# Patient Record
Sex: Male | Born: 1945
Health system: Southern US, Community
[De-identification: ages and names within clinical notes are randomized; demographics above are authoritative.]

## PROBLEM LIST (undated history)

## (undated) DIAGNOSIS — E785 Hyperlipidemia, unspecified: Secondary | ICD-10-CM

## (undated) DIAGNOSIS — E119 Type 2 diabetes mellitus without complications: Secondary | ICD-10-CM

## (undated) DIAGNOSIS — J189 Pneumonia, unspecified organism: Secondary | ICD-10-CM

## (undated) DIAGNOSIS — I10 Essential (primary) hypertension: Secondary | ICD-10-CM

## (undated) HISTORY — DX: Essential (primary) hypertension: I10

## (undated) HISTORY — PX: TONSILLECTOMY: SUR1361

## (undated) HISTORY — DX: Hyperlipidemia, unspecified: E78.5

## (undated) HISTORY — DX: Type 2 diabetes mellitus without complications: E11.9

---
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
## 1994-06-02 DIAGNOSIS — J189 Pneumonia, unspecified organism: Secondary | ICD-10-CM

## 1994-06-02 HISTORY — DX: Pneumonia, unspecified organism: J18.9

## 1995-06-03 HISTORY — PX: ULNAR NERVE REPAIR: SHX2594

## 2001-03-31 ENCOUNTER — Encounter: Payer: Self-pay | Admitting: Internal Medicine

## 2001-03-31 ENCOUNTER — Encounter: Admission: RE | Admit: 2001-03-31 | Discharge: 2001-03-31 | Payer: Self-pay | Admitting: Internal Medicine

## 2011-05-23 ENCOUNTER — Other Ambulatory Visit: Payer: Self-pay | Admitting: Internal Medicine

## 2011-05-23 DIAGNOSIS — R1011 Right upper quadrant pain: Secondary | ICD-10-CM

## 2011-05-28 ENCOUNTER — Ambulatory Visit
Admission: RE | Admit: 2011-05-28 | Discharge: 2011-05-28 | Disposition: A | Discharge status: Home / Self Care | Payer: BC Managed Care – PPO | Source: Ambulatory Visit | Attending: Internal Medicine | Admitting: Internal Medicine

## 2011-05-28 DIAGNOSIS — R1011 Right upper quadrant pain: Secondary | ICD-10-CM

## 2012-05-27 ENCOUNTER — Other Ambulatory Visit: Payer: Self-pay | Admitting: Internal Medicine

## 2012-05-27 DIAGNOSIS — R1011 Right upper quadrant pain: Secondary | ICD-10-CM

## 2012-05-28 ENCOUNTER — Ambulatory Visit
Admission: RE | Admit: 2012-05-28 | Discharge: 2012-05-28 | Disposition: A | Discharge status: Home / Self Care | Payer: BC Managed Care – PPO | Source: Ambulatory Visit | Attending: Internal Medicine | Admitting: Internal Medicine

## 2012-05-28 DIAGNOSIS — R1011 Right upper quadrant pain: Secondary | ICD-10-CM

## 2012-06-03 ENCOUNTER — Encounter (INDEPENDENT_AMBULATORY_CARE_PROVIDER_SITE_OTHER): Payer: Self-pay | Admitting: Surgery

## 2012-06-14 ENCOUNTER — Encounter (INDEPENDENT_AMBULATORY_CARE_PROVIDER_SITE_OTHER): Payer: Self-pay | Admitting: Surgery

## 2012-06-14 ENCOUNTER — Ambulatory Visit (INDEPENDENT_AMBULATORY_CARE_PROVIDER_SITE_OTHER): Payer: BC Managed Care – PPO | Admitting: Surgery

## 2012-06-14 VITALS — BP 132/88 | HR 64 | Temp 97.9°F | Resp 16 | Ht 69.0 in | Wt 256.8 lb

## 2012-06-14 DIAGNOSIS — K8010 Calculus of gallbladder with chronic cholecystitis without obstruction: Secondary | ICD-10-CM

## 2012-06-14 NOTE — Progress Notes (Signed)
Patient ID: Jason Garrett, male   DOB: [birth year], 67 y.o.   MRN: [id number]  Chief Complaint  Patient presents with  . Cholelithiasis    new pt- eval gb w/ stones    HPI Jason Garrett is a 67 y.o. male.  Referred by Dr. Su Hilt for evaluation of gallbladder disease. HPI This is a 67 year old male who presents with a five-year history of intermittent right upper quadrant abdominal pain associated with abdominal bloating and radiation of pain through to his back. He did have an ultrasound in 2012 that showed some gallstones but apparently there was a transcription error and the conclusions of the ultrasound read "no gallstones". His symptoms have become slightly more frequent and the patient had a repeat ultrasound which showed multiple gallstones but no sign of cholecystitis. Liver function test are normal. The symptoms did not seem to be exacerbated by any type of food. The patient claims to eat a low-fat diet. He denies any nausea, vomiting, and does report episodes of diarrhea about once a week.  Past Medical History  Diagnosis Date  . Diabetes mellitus without complication   . Hypertension   . Hyperlipidemia     Past Surgical History  Procedure Date  . Ulnar nerve repair 1997  . Hernia repair 1950    Family History  Problem Relation Age of Onset  . Cancer Father     skin    Social History History  Substance Use Topics  . Smoking status: Never Smoker   . Smokeless tobacco: Not on file  . Alcohol Use: Yes    Allergies  Allergen Reactions  . Prilocaine Hives  This is an old type of local anesthetic.  He states that he did have some lidocaine with his ulnar nerve surgery without any problems.  Current Outpatient Prescriptions  Medication Sig Dispense Refill  . aspirin 81 MG tablet Take 81 mg by mouth daily.      . chlorthalidone (HYGROTON) 25 MG tablet Take 25 mg by mouth daily.      Marland Kitchen doxazosin (CARDURA) 1 MG tablet Take 1 mg by mouth at bedtime.      Marland Kitchen  glimepiride (AMARYL) 2 MG tablet Take 2 mg by mouth daily before breakfast.      . losartan (COZAAR) 25 MG tablet Take 25 mg by mouth daily.      . simvastatin (ZOCOR) 20 MG tablet Take 20 mg by mouth every evening.      . verapamil (COVERA HS) 240 MG (CO) 24 hr tablet Take 240 mg by mouth at bedtime.        Review of Systems Review of Systems  Constitutional: Negative for fever, chills and unexpected weight change.  HENT: Negative for hearing loss, congestion, sore throat, trouble swallowing and voice change.   Eyes: Negative for visual disturbance.  Respiratory: Negative for cough and wheezing.   Cardiovascular: Negative for chest pain, palpitations and leg swelling.  Gastrointestinal: Positive for abdominal pain, diarrhea and abdominal distention. Negative for nausea, vomiting, constipation, blood in stool, anal bleeding and rectal pain.  Genitourinary: Negative for hematuria and difficulty urinating.  Musculoskeletal: Negative for arthralgias.  Skin: Negative for rash and wound.  Neurological: Negative for seizures, syncope, weakness and headaches.  Hematological: Negative for adenopathy. Does not bruise/bleed easily.  Psychiatric/Behavioral: Negative for confusion.    Blood pressure 132/88, pulse 64, temperature 97.9 F (36.6 C), temperature source Temporal, resp. rate 16, height 5\' 9"  (1.753 m), weight 256 lb 12.8 oz (116.484 kg).  Physical Exam Physical Exam WDWN in NAD HEENT:  EOMI, sclera anicteric Neck:  No masses, no thyromegaly Lungs:  CTA bilaterally; normal respiratory effort CV:  Regular rate and rhythm; no murmurs Abd:  +bowel sounds, obese, soft, minimal RUQ tenderness Ext:  Well-perfused; no edema Skin:  Warm, dry; no sign of jaundice  Data Reviewed RADIOLOGY REPORT*  Clinical Data: Right upper quadrant pain.  COMPLETE ABDOMINAL ULTRASOUND  Comparison: Ultrasound dated 05/28/2011  Findings:  Gallbladder: Numerous gallstones. Negative sonographic Murphy's    sign. No gallbladder wall thickening.  Common bile duct: Normal. 4.2 mm in diameter.  Liver: Liver parenchyma is echogenic consistent with fat pad  steatosis, unchanged.  IVC: Appears normal.  Pancreas: Head and body of the pancreas are normal. Tail the  pancreas is obscured by bowel.  Spleen: Normal. 10.6 cm in length.  Right Kidney: 13.5 cm in length. 1.9 cm simple appearing cyst in  the mid kidney. 8 millimeter exophytic cyst.  Left Kidney: 14.3 cm in length. 3.7 cm cyst on the upper pole. 8  mm cyst in the midportion of the left kidney. 3.2 cm cyst in the  lower pole.  Abdominal aorta: 2.8 cm maximum diameter.  IMPRESSION:  Multiple gallstones. Stable renal cysts. The tail of the pancreas  is obscured by bowel gas.    Assessment    Chronic calculous cholecystitis    Plan    Laparoscopic cholecystectomy with intraoperative cholangiogram.  The surgical procedure has been discussed with the patient.  Potential risks, benefits, alternative treatments, and expected outcomes have been explained.  All of the patient's questions at this time have been answered.  The likelihood of reaching the patient's treatment goal is good.  The patient understand the proposed surgical procedure and wishes to proceed.        Arne Schlender K. 06/14/2012, 10:01 AM

## 2012-06-14 NOTE — Patient Instructions (Signed)
Stop your aspirin 5 days before surgery.

## 2012-07-02 ENCOUNTER — Encounter (HOSPITAL_COMMUNITY): Payer: Self-pay | Admitting: *Deleted

## 2012-07-02 ENCOUNTER — Encounter (HOSPITAL_COMMUNITY): Payer: Self-pay | Admitting: Pharmacy Technician

## 2012-07-05 ENCOUNTER — Other Ambulatory Visit (HOSPITAL_COMMUNITY): Payer: Self-pay | Admitting: *Deleted

## 2012-07-05 NOTE — Progress Notes (Signed)
EKG from Dr. Su Hilt office 05/24/2012 on chart.

## 2012-07-14 ENCOUNTER — Ambulatory Visit (HOSPITAL_COMMUNITY): Payer: BC Managed Care – PPO | Admitting: Anesthesiology

## 2012-07-14 ENCOUNTER — Encounter (HOSPITAL_COMMUNITY): Payer: Self-pay | Admitting: *Deleted

## 2012-07-14 ENCOUNTER — Ambulatory Visit (HOSPITAL_COMMUNITY): Payer: BC Managed Care – PPO

## 2012-07-14 ENCOUNTER — Encounter (HOSPITAL_COMMUNITY)
Admission: RE | Disposition: A | Discharge status: Home / Self Care | Payer: Self-pay | Source: Ambulatory Visit | Attending: Surgery

## 2012-07-14 ENCOUNTER — Encounter (HOSPITAL_COMMUNITY): Payer: Self-pay | Admitting: Anesthesiology

## 2012-07-14 ENCOUNTER — Ambulatory Visit (HOSPITAL_COMMUNITY)
Admission: RE | Admit: 2012-07-14 | Discharge: 2012-07-14 | Disposition: A | Discharge status: Home / Self Care | Payer: BC Managed Care – PPO | Source: Ambulatory Visit | Attending: Surgery | Admitting: Surgery

## 2012-07-14 DIAGNOSIS — Z79899 Other long term (current) drug therapy: Secondary | ICD-10-CM | POA: Insufficient documentation

## 2012-07-14 DIAGNOSIS — I10 Essential (primary) hypertension: Secondary | ICD-10-CM | POA: Insufficient documentation

## 2012-07-14 DIAGNOSIS — E785 Hyperlipidemia, unspecified: Secondary | ICD-10-CM | POA: Insufficient documentation

## 2012-07-14 DIAGNOSIS — K8010 Calculus of gallbladder with chronic cholecystitis without obstruction: Secondary | ICD-10-CM | POA: Insufficient documentation

## 2012-07-14 DIAGNOSIS — E119 Type 2 diabetes mellitus without complications: Secondary | ICD-10-CM | POA: Insufficient documentation

## 2012-07-14 DIAGNOSIS — Z7982 Long term (current) use of aspirin: Secondary | ICD-10-CM | POA: Insufficient documentation

## 2012-07-14 HISTORY — DX: Pneumonia, unspecified organism: J18.9

## 2012-07-14 HISTORY — PX: CHOLECYSTECTOMY: SHX55

## 2012-07-14 LAB — GLUCOSE, CAPILLARY
Glucose-Capillary: 82 mg/dL (ref 70–99)
Glucose-Capillary: 119 mg/dL — ABNORMAL HIGH (ref 70–99)

## 2012-07-14 LAB — BASIC METABOLIC PANEL
CO2: 30 mEq/L (ref 19–32)
Chloride: 100 mEq/L (ref 96–112)
Creatinine, Ser: 1.63 mg/dL — ABNORMAL HIGH (ref 0.50–1.35)
GFR calc Af Amer: 49 mL/min — ABNORMAL LOW (ref >90)
Potassium: 3.2 mEq/L — ABNORMAL LOW (ref 3.5–5.1)
Sodium: 139 mEq/L (ref 135–145)

## 2012-07-14 LAB — CBC
HCT: 42.9 % (ref 39.0–52.0)
Hemoglobin: 15.3 g/dL (ref 13.0–17.0)
MCV: 86.3 fL (ref 78.0–100.0)
RBC: 4.97 MIL/uL (ref 4.22–5.81)
RDW: 12.9 % (ref 11.5–15.5)
WBC: 7.7 10*3/uL (ref 4.0–10.5)

## 2012-07-14 LAB — SURGICAL PCR SCREEN: Staphylococcus aureus: NEGATIVE

## 2012-07-14 SURGERY — LAPAROSCOPIC CHOLECYSTECTOMY WITH INTRAOPERATIVE CHOLANGIOGRAM
Anesthesia: General | Wound class: Clean Contaminated

## 2012-07-14 MED ORDER — LACTATED RINGERS IR SOLN
Status: DC | PRN
  Administered 2012-07-14: 1

## 2012-07-14 MED ORDER — HYDROMORPHONE HCL PF 1 MG/ML IJ SOLN: 0.2500 mg | INTRAMUSCULAR | Status: DC | PRN

## 2012-07-14 MED ORDER — MORPHINE SULFATE 10 MG/ML IJ SOLN: 2.0000 mg | INTRAMUSCULAR | Status: DC | PRN

## 2012-07-14 MED ORDER — GLYCOPYRROLATE 0.2 MG/ML IJ SOLN
INTRAMUSCULAR | Status: DC | PRN
  Administered 2012-07-14: .6 mg via INTRAVENOUS

## 2012-07-14 MED ORDER — ACETAMINOPHEN 10 MG/ML IV SOLN
INTRAVENOUS | Status: DC | PRN
  Administered 2012-07-14: 1000 mg via INTRAVENOUS

## 2012-07-14 MED ORDER — OXYCODONE-ACETAMINOPHEN 5-325 MG PO TABS: 1.0000 | ORAL_TABLET | ORAL | Status: DC | PRN

## 2012-07-14 MED ORDER — PROMETHAZINE HCL 25 MG/ML IJ SOLN: 6.2500 mg | INTRAMUSCULAR | Status: DC | PRN

## 2012-07-14 MED ORDER — SODIUM CHLORIDE 0.9 % IV SOLN
INTRAVENOUS | Status: DC | PRN
  Administered 2012-07-14: 09:00:00

## 2012-07-14 MED ORDER — 0.9 % SODIUM CHLORIDE (POUR BTL) OPTIME
TOPICAL | Status: DC | PRN
  Administered 2012-07-14: 1000 mL

## 2012-07-14 MED ORDER — ONDANSETRON HCL 4 MG/2ML IJ SOLN: 4.0000 mg | INTRAMUSCULAR | Status: DC | PRN

## 2012-07-14 MED ORDER — LACTATED RINGERS IV SOLN
INTRAVENOUS | Status: DC | PRN
  Administered 2012-07-14 (×2): via INTRAVENOUS

## 2012-07-14 MED ORDER — IOHEXOL 300 MG/ML  SOLN
INTRAMUSCULAR | Status: AC
  Filled 2012-07-14: qty 1

## 2012-07-14 MED ORDER — EPHEDRINE SULFATE 50 MG/ML IJ SOLN
INTRAMUSCULAR | Status: DC | PRN
  Administered 2012-07-14 (×2): 10 mg via INTRAVENOUS

## 2012-07-14 MED ORDER — NEOSTIGMINE METHYLSULFATE 1 MG/ML IJ SOLN
INTRAMUSCULAR | Status: DC | PRN
  Administered 2012-07-14: 4 mg via INTRAVENOUS

## 2012-07-14 MED ORDER — PHENYLEPHRINE HCL 10 MG/ML IJ SOLN
INTRAMUSCULAR | Status: DC | PRN
  Administered 2012-07-14: 80 ug via INTRAVENOUS

## 2012-07-14 MED ORDER — CEFAZOLIN SODIUM-DEXTROSE 2-3 GM-% IV SOLR
INTRAVENOUS | Status: AC
  Filled 2012-07-14: qty 50

## 2012-07-14 MED ORDER — BUPIVACAINE-EPINEPHRINE PF 0.25-1:200000 % IJ SOLN
INTRAMUSCULAR | Status: AC
  Filled 2012-07-14: qty 90

## 2012-07-14 MED ORDER — BUPIVACAINE-EPINEPHRINE 0.25% -1:200000 IJ SOLN
INTRAMUSCULAR | Status: DC | PRN
  Administered 2012-07-14: 18 mL

## 2012-07-14 MED ORDER — ATROPINE SULFATE 0.4 MG/ML IJ SOLN
INTRAMUSCULAR | Status: DC | PRN
  Administered 2012-07-14: .4 mg via INTRAVENOUS

## 2012-07-14 MED ORDER — MUPIROCIN 2 % EX OINT
TOPICAL_OINTMENT | Freq: Two times a day (BID) | CUTANEOUS | Status: DC
  Administered 2012-07-14: 08:00:00 via NASAL
  Filled 2012-07-14: qty 22

## 2012-07-14 MED ORDER — ACETAMINOPHEN 10 MG/ML IV SOLN
INTRAVENOUS | Status: AC
  Filled 2012-07-14: qty 100

## 2012-07-14 MED ORDER — CEFAZOLIN SODIUM-DEXTROSE 2-3 GM-% IV SOLR
2.0000 g | INTRAVENOUS | Status: AC
  Administered 2012-07-14: 2 g via INTRAVENOUS

## 2012-07-14 MED ORDER — CHLORHEXIDINE GLUCONATE 4 % EX LIQD
1.0000 "application " | Freq: Once | CUTANEOUS | Status: DC
  Filled 2012-07-14: qty 15

## 2012-07-14 MED ORDER — CISATRACURIUM BESYLATE (PF) 10 MG/5ML IV SOLN
INTRAVENOUS | Status: DC | PRN
  Administered 2012-07-14: 6 mg via INTRAVENOUS

## 2012-07-14 MED ORDER — PROPOFOL 10 MG/ML IV BOLUS
INTRAVENOUS | Status: DC | PRN
  Administered 2012-07-14: 200 mg via INTRAVENOUS

## 2012-07-14 MED ORDER — SUFENTANIL CITRATE 50 MCG/ML IV SOLN
INTRAVENOUS | Status: DC | PRN
  Administered 2012-07-14: 20 ug via INTRAVENOUS

## 2012-07-14 MED ORDER — ONDANSETRON HCL 4 MG/2ML IJ SOLN
INTRAMUSCULAR | Status: DC | PRN
  Administered 2012-07-14: 4 mg via INTRAVENOUS

## 2012-07-14 MED ORDER — LIDOCAINE HCL (CARDIAC) 20 MG/ML IV SOLN
INTRAVENOUS | Status: DC | PRN
  Administered 2012-07-14: 100 mg via INTRAVENOUS

## 2012-07-14 MED ORDER — SUCCINYLCHOLINE CHLORIDE 20 MG/ML IJ SOLN
INTRAMUSCULAR | Status: DC | PRN
  Administered 2012-07-14: 100 mg via INTRAVENOUS

## 2012-07-14 MED ORDER — MIDAZOLAM HCL 5 MG/5ML IJ SOLN
INTRAMUSCULAR | Status: DC | PRN
  Administered 2012-07-14: 2 mg via INTRAVENOUS

## 2012-07-14 SURGICAL SUPPLY — 38 items
APPLIER CLIP ROT 10 11.4 M/L (STAPLE) ×2
BENZOIN TINCTURE PRP APPL 2/3 (GAUZE/BANDAGES/DRESSINGS) ×2 IMPLANT
CANISTER SUCTION 2500CC (MISCELLANEOUS) ×2 IMPLANT
CHLORAPREP W/TINT 26ML (MISCELLANEOUS) ×2 IMPLANT
CLIP APPLIE ROT 10 11.4 M/L (STAPLE) ×1 IMPLANT
CLOTH BEACON ORANGE TIMEOUT ST (SAFETY) ×2 IMPLANT
COVER MAYO STAND STRL (DRAPES) ×2 IMPLANT
DECANTER SPIKE VIAL GLASS SM (MISCELLANEOUS) IMPLANT
DRAPE C-ARM 42X72 X-RAY (DRAPES) ×2 IMPLANT
DRAPE LAPAROSCOPIC ABDOMINAL (DRAPES) ×2 IMPLANT
DRAPE UTILITY XL STRL (DRAPES) ×2 IMPLANT
DRSG TEGADERM 2-3/8X2-3/4 SM (GAUZE/BANDAGES/DRESSINGS) ×6 IMPLANT
DRSG TEGADERM 4X4.75 (GAUZE/BANDAGES/DRESSINGS) ×2 IMPLANT
ELECT REM PT RETURN 9FT ADLT (ELECTROSURGICAL) ×2
ELECTRODE REM PT RTRN 9FT ADLT (ELECTROSURGICAL) ×1 IMPLANT
FILTER SMOKE EVAC LAPAROSHD (FILTER) IMPLANT
GLOVE BIO SURGEON STRL SZ7 (GLOVE) ×2 IMPLANT
GLOVE BIOGEL PI IND STRL 7.0 (GLOVE) ×1 IMPLANT
GLOVE BIOGEL PI IND STRL 7.5 (GLOVE) ×1 IMPLANT
GLOVE BIOGEL PI INDICATOR 7.0 (GLOVE) ×1
GLOVE BIOGEL PI INDICATOR 7.5 (GLOVE) ×1
GOWN STRL NON-REIN LRG LVL3 (GOWN DISPOSABLE) ×4 IMPLANT
GOWN STRL REIN XL XLG (GOWN DISPOSABLE) ×4 IMPLANT
KIT BASIN OR (CUSTOM PROCEDURE TRAY) ×2 IMPLANT
NS IRRIG 1000ML POUR BTL (IV SOLUTION) ×2 IMPLANT
POUCH SPECIMEN RETRIEVAL 10MM (ENDOMECHANICALS) ×2 IMPLANT
RINGERS IRRIG 1000ML POUR BTL (IV SOLUTION) ×2 IMPLANT
SET CHOLANGIOGRAPH MIX (MISCELLANEOUS) ×2 IMPLANT
SET IRRIG TUBING LAPAROSCOPIC (IRRIGATION / IRRIGATOR) ×2 IMPLANT
SOLUTION ANTI FOG 6CC (MISCELLANEOUS) ×2 IMPLANT
STRIP CLOSURE SKIN 1/2X4 (GAUZE/BANDAGES/DRESSINGS) ×2 IMPLANT
SUT MNCRL AB 4-0 PS2 18 (SUTURE) ×2 IMPLANT
TOWEL OR 17X26 10 PK STRL BLUE (TOWEL DISPOSABLE) ×4 IMPLANT
TRAY LAP CHOLE (CUSTOM PROCEDURE TRAY) ×2 IMPLANT
TROCAR BLADELESS OPT 5 75 (ENDOMECHANICALS) ×4 IMPLANT
TROCAR XCEL BLUNT TIP 100MML (ENDOMECHANICALS) ×2 IMPLANT
TROCAR XCEL NON-BLD 11X100MML (ENDOMECHANICALS) ×2 IMPLANT
TUBING INSUFFLATION 10FT LAP (TUBING) ×2 IMPLANT

## 2012-07-14 NOTE — Preoperative (Signed)
Beta Blockers   Reason not to administer Beta Blockers:Not Applicable 

## 2012-07-14 NOTE — Progress Notes (Signed)
Up to br  tol well  Unable to void. Return to bed and will" try again later"

## 2012-07-14 NOTE — H&P (View-Only) (Signed)
Patient ID: Jason Garrett, male   DOB: 03/16/1946, 66 y.o.   MRN: [id number]  Chief Complaint  Patient presents with  . Cholelithiasis    new pt- eval gb w/ stones    HPI Jason Garrett is a 66 y.o. male.  Referred by Dr. Roberts for evaluation of gallbladder disease. HPI This is a 66-year-old male who presents with a five-year history of intermittent right upper quadrant abdominal pain associated with abdominal bloating and radiation of pain through to his back. He did have an ultrasound in 2012 that showed some gallstones but apparently there was a transcription error and the conclusions of the ultrasound read "no gallstones". His symptoms have become slightly more frequent and the patient had a repeat ultrasound which showed multiple gallstones but no sign of cholecystitis. Liver function test are normal. The symptoms did not seem to be exacerbated by any type of food. The patient claims to eat a low-fat diet. He denies any nausea, vomiting, and does report episodes of diarrhea about once a week.  Past Medical History  Diagnosis Date  . Diabetes mellitus without complication   . Hypertension   . Hyperlipidemia     Past Surgical History  Procedure Date  . Ulnar nerve repair 1997  . Hernia repair 1950    Family History  Problem Relation Age of Onset  . Cancer Father     skin    Social History History  Substance Use Topics  . Smoking status: Never Smoker   . Smokeless tobacco: Not on file  . Alcohol Use: Yes    Allergies  Allergen Reactions  . Prilocaine Hives  This is an old type of local anesthetic.  He states that he did have some lidocaine with his ulnar nerve surgery without any problems.  Current Outpatient Prescriptions  Medication Sig Dispense Refill  . aspirin 81 MG tablet Take 81 mg by mouth daily.      . chlorthalidone (HYGROTON) 25 MG tablet Take 25 mg by mouth daily.      . doxazosin (CARDURA) 1 MG tablet Take 1 mg by mouth at bedtime.      .  glimepiride (AMARYL) 2 MG tablet Take 2 mg by mouth daily before breakfast.      . losartan (COZAAR) 25 MG tablet Take 25 mg by mouth daily.      . simvastatin (ZOCOR) 20 MG tablet Take 20 mg by mouth every evening.      . verapamil (COVERA HS) 240 MG (CO) 24 hr tablet Take 240 mg by mouth at bedtime.        Review of Systems Review of Systems  Constitutional: Negative for fever, chills and unexpected weight change.  HENT: Negative for hearing loss, congestion, sore throat, trouble swallowing and voice change.   Eyes: Negative for visual disturbance.  Respiratory: Negative for cough and wheezing.   Cardiovascular: Negative for chest pain, palpitations and leg swelling.  Gastrointestinal: Positive for abdominal pain, diarrhea and abdominal distention. Negative for nausea, vomiting, constipation, blood in stool, anal bleeding and rectal pain.  Genitourinary: Negative for hematuria and difficulty urinating.  Musculoskeletal: Negative for arthralgias.  Skin: Negative for rash and wound.  Neurological: Negative for seizures, syncope, weakness and headaches.  Hematological: Negative for adenopathy. Does not bruise/bleed easily.  Psychiatric/Behavioral: Negative for confusion.    Blood pressure 132/88, pulse 64, temperature 97.9 F (36.6 C), temperature source Temporal, resp. rate 16, height 5' 9" (1.753 m), weight 256 lb 12.8 oz (116.484 kg).    Physical Exam Physical Exam WDWN in NAD HEENT:  EOMI, sclera anicteric Neck:  No masses, no thyromegaly Lungs:  CTA bilaterally; normal respiratory effort CV:  Regular rate and rhythm; no murmurs Abd:  +bowel sounds, obese, soft, minimal RUQ tenderness Ext:  Well-perfused; no edema Skin:  Warm, dry; no sign of jaundice  Data Reviewed RADIOLOGY REPORT*  Clinical Data: Right upper quadrant pain.  COMPLETE ABDOMINAL ULTRASOUND  Comparison: Ultrasound dated 05/28/2011  Findings:  Gallbladder: Numerous gallstones. Negative sonographic Murphy's    sign. No gallbladder wall thickening.  Common bile duct: Normal. 4.2 mm in diameter.  Liver: Liver parenchyma is echogenic consistent with fat pad  steatosis, unchanged.  IVC: Appears normal.  Pancreas: Head and body of the pancreas are normal. Tail the  pancreas is obscured by bowel.  Spleen: Normal. 10.6 cm in length.  Right Kidney: 13.5 cm in length. 1.9 cm simple appearing cyst in  the mid kidney. 8 millimeter exophytic cyst.  Left Kidney: 14.3 cm in length. 3.7 cm cyst on the upper pole. 8  mm cyst in the midportion of the left kidney. 3.2 cm cyst in the  lower pole.  Abdominal aorta: 2.8 cm maximum diameter.  IMPRESSION:  Multiple gallstones. Stable renal cysts. The tail of the pancreas  is obscured by bowel gas.    Assessment    Chronic calculous cholecystitis    Plan    Laparoscopic cholecystectomy with intraoperative cholangiogram.  The surgical procedure has been discussed with the patient.  Potential risks, benefits, alternative treatments, and expected outcomes have been explained.  All of the patient's questions at this time have been answered.  The likelihood of reaching the patient's treatment goal is good.  The patient understand the proposed surgical procedure and wishes to proceed.        Kennith Morss K. 06/14/2012, 10:01 AM    

## 2012-07-14 NOTE — Anesthesia Postprocedure Evaluation (Signed)
  Anesthesia Post-op Note  Patient: Jason Garrett  Procedure(s) Performed: Procedure(s) (LRB): LAPAROSCOPIC CHOLECYSTECTOMY WITH INTRAOPERATIVE CHOLANGIOGRAM (N/A)  Patient Location: PACU  Anesthesia Type: General  Level of Consciousness: awake and alert   Airway and Oxygen Therapy: Patient Spontanous Breathing  Post-op Pain: mild  Post-op Assessment: Post-op Vital signs reviewed, Patient's Cardiovascular Status Stable, Respiratory Function Stable, Patent Airway and No signs of Nausea or vomiting  Last Vitals:  Filed Vitals:   07/14/12 1100  BP: 101/53  Pulse: 51  Temp:   Resp: 12    Post-op Vital Signs: stable   Complications: No apparent anesthesia complications

## 2012-07-14 NOTE — Anesthesia Preprocedure Evaluation (Addendum)
Anesthesia Evaluation  Patient identified by MRN, date of birth, ID band Patient awake    Reviewed: Allergy & Precautions, H&P , NPO status , Patient's Chart, lab work & pertinent test results  Airway Mallampati: II TM Distance: >3 FB Neck ROM: Full    Dental no notable dental hx.    Pulmonary pneumonia -, resolved,  breath sounds clear to auscultation  Pulmonary exam normal       Cardiovascular hypertension, Pt. on medications Rhythm:Regular Rate:Normal     Neuro/Psych negative neurological ROS  negative psych ROS   GI/Hepatic negative GI ROS, Neg liver ROS,   Endo/Other  diabetes, Type 2, Oral Hypoglycemic Agents  Renal/GU Renal InsufficiencyRenal disease  negative genitourinary   Musculoskeletal negative musculoskeletal ROS (+)   Abdominal (+) + obese,   Peds negative pediatric ROS (+)  Hematology negative hematology ROS (+)   Anesthesia Other Findings   Reproductive/Obstetrics negative OB ROS                          Anesthesia Physical Anesthesia Plan  ASA: III  Anesthesia Plan: General   Post-op Pain Management:    Induction: Intravenous  Airway Management Planned: Oral ETT  Additional Equipment:   Intra-op Plan:   Post-operative Plan: Extubation in OR  Informed Consent: I have reviewed the patients History and Physical, chart, labs and discussed the procedure including the risks, benefits and alternatives for the proposed anesthesia with the patient or authorized representative who has indicated his/her understanding and acceptance.   Dental advisory given  Plan Discussed with: CRNA  Anesthesia Plan Comments:         Anesthesia Quick Evaluation

## 2012-07-14 NOTE — Transfer of Care (Signed)
Immediate Anesthesia Transfer of Care Note  Patient: Jason Garrett  Procedure(s) Performed: Procedure(s): LAPAROSCOPIC CHOLECYSTECTOMY WITH INTRAOPERATIVE CHOLANGIOGRAM (N/A)  Patient Location: PACU  Anesthesia Type:General  Level of Consciousness: oriented and sedated  Airway & Oxygen Therapy: Patient Spontanous Breathing and Patient connected to face mask oxygen  Post-op Assessment: Report given to PACU RN and Post -op Vital signs reviewed and stable  Post vital signs: Reviewed and stable  Complications: No apparent anesthesia complications

## 2012-07-14 NOTE — Interval H&P Note (Signed)
History and Physical Interval Note:  07/14/2012 8:20 AM  Jason Garrett  has presented today for surgery, with the diagnosis of chronic calculus cholecystitis   The various methods of treatment have been discussed with the patient and family. After consideration of risks, benefits and other options for treatment, the patient has consented to  Procedure(s): LAPAROSCOPIC CHOLECYSTECTOMY WITH INTRAOPERATIVE CHOLANGIOGRAM (N/A) as a surgical intervention .  The patient's history has been reviewed, patient examined, no change in status, stable for surgery.  I have reviewed the patient's chart and labs.  Questions were answered to the patient's satisfaction.     Denis Koppel K.

## 2012-07-14 NOTE — Anesthesia Procedure Notes (Signed)
Procedure Name: Intubation Date/Time: 07/14/2012 8:45 AM Performed by: Leroy Libman L Patient Re-evaluated:Patient Re-evaluated prior to inductionOxygen Delivery Method: Circle system utilized Preoxygenation: Pre-oxygenation with 100% oxygen Intubation Type: IV induction Ventilation: Mask ventilation without difficulty and Oral airway inserted - appropriate to patient size Laryngoscope Size: Miller and 3 Grade View: Grade I Tube type: Oral Tube size: 8.0 mm Number of attempts: 1 Airway Equipment and Method: Stylet Placement Confirmation: ETT inserted through vocal cords under direct vision,  breath sounds checked- equal and bilateral and positive ETCO2 Secured at: 23 cm Tube secured with: Tape Dental Injury: Teeth and Oropharynx as per pre-operative assessment

## 2012-07-14 NOTE — Op Note (Signed)
Laparoscopic Cholecystectomy with IOC Procedure Note  Indications: This patient presents with symptomatic gallbladder disease and will undergo laparoscopic cholecystectomy.  Pre-operative Diagnosis: Same  Post-operative Diagnosis: Calculus of gallbladder with other cholecystitis, without mention of obstruction  Surgeon: Khizar Fiorella K.   Assistants: Emelia Loron   Anesthesia: General endotracheal anesthesia  ASA Class: 2  Procedure Details  The patient was seen again in the Holding Room. The risks, benefits, complications, treatment options, and expected outcomes were discussed with the patient. The possibilities of reaction to medication, pulmonary aspiration, perforation of viscus, bleeding, recurrent infection, finding a normal gallbladder, the need for additional procedures, failure to diagnose a condition, the possible need to convert to an open procedure, and creating a complication requiring transfusion or operation were discussed with the patient. The likelihood of improving the patient's symptoms with return to their baseline status is good.  The patient and/or family concurred with the proposed plan, giving informed consent. The site of surgery properly noted. The patient was taken to Operating Room, identified as Jason Garrett and the procedure verified as Laparoscopic Cholecystectomy with Intraoperative Cholangiogram. A Time Out was held and the above information confirmed.  Prior to the induction of general anesthesia, antibiotic prophylaxis was administered. General endotracheal anesthesia was then administered and tolerated well. After the induction, the abdomen was prepped with Chloraprep and draped in the sterile fashion. The patient was positioned in the supine position.  Local anesthetic agent was injected into the skin near the umbilicus and an incision made. We dissected down to the abdominal fascia with blunt dissection.  The fascia was incised vertically and we  entered the peritoneal cavity bluntly.  A pursestring suture of 0-Vicryl was placed around the fascial opening.  The Hasson cannula was inserted and secured with the stay suture.  Pneumoperitoneum was then created with CO2 and tolerated well without any adverse changes in the patient's vital signs. An 11-mm port was placed in the subxiphoid position.  Two 5-mm ports were placed in the right upper quadrant. All skin incisions were infiltrated with a local anesthetic agent before making the incision and placing the trocars.   We positioned the patient in reverse Trendelenburg, tilted slightly to the patient's left.  The gallbladder was identified, the fundus grasped and retracted cephalad. Adhesions were lysed bluntly and with the electrocautery where indicated, taking care not to injure any adjacent organs or viscus. The infundibulum was grasped and retracted laterally, exposing the peritoneum overlying the triangle of Calot. This was then divided and exposed in a blunt fashion. A critical view of the cystic duct and cystic artery was obtained.  The cystic duct was clearly identified and bluntly dissected circumferentially. The cystic duct was ligated with a clip distally.   An incision was made in the cystic duct and the [facility] cholangiogram catheter introduced. The catheter was secured using a clip. A cholangiogram was then obtained which showed good visualization of the distal and proximal biliary tree with no sign of filling defects or obstruction.  Contrast flowed easily into the duodenum. The catheter was then removed.   The cystic duct was then ligated with clips and divided. The cystic artery was identified, dissected free, ligated with clips and divided as well.   The gallbladder was dissected from the liver bed in retrograde fashion with the electrocautery. The gallbladder was removed and placed in an Endocatch sac. The liver bed was irrigated and inspected. Hemostasis was achieved with the  electrocautery. Copious irrigation was utilized and was repeatedly aspirated  until clear.  The gallbladder and Endocatch sac were then removed through the umbilical port site.  The pursestring suture was used to close the umbilical fascia.    We again inspected the right upper quadrant for hemostasis.  Pneumoperitoneum was released as we removed the trocars.  4-0 Monocryl was used to close the skin.   Benzoin, steri-strips, and clean dressings were applied. The patient was then extubated and brought to the recovery room in stable condition. Instrument, sponge, and needle counts were correct at closure and at the conclusion of the case.   Findings: Cholecystitis with Cholelithiasis  Estimated Blood Loss: Minimal         Drains: none         Specimens: Gallbladder           Complications: None; patient tolerated the procedure well.         Disposition: PACU - hemodynamically stable.         Condition: stable  Wilmon Arms. Corliss Skains, MD, [facility]  07/14/2012 10:01 AM

## 2012-07-16 ENCOUNTER — Encounter (HOSPITAL_COMMUNITY): Payer: Self-pay | Admitting: Surgery

## 2012-07-20 ENCOUNTER — Encounter (INDEPENDENT_AMBULATORY_CARE_PROVIDER_SITE_OTHER): Payer: Self-pay

## 2012-07-30 ENCOUNTER — Ambulatory Visit (INDEPENDENT_AMBULATORY_CARE_PROVIDER_SITE_OTHER): Payer: BC Managed Care – PPO | Admitting: Surgery

## 2012-07-30 ENCOUNTER — Encounter (INDEPENDENT_AMBULATORY_CARE_PROVIDER_SITE_OTHER): Payer: Self-pay | Admitting: Surgery

## 2012-07-30 VITALS — BP 148/80 | HR 84 | Temp 97.3°F | Resp 20 | Ht 69.5 in | Wt 252.6 lb

## 2012-07-30 DIAGNOSIS — K8010 Calculus of gallbladder with chronic cholecystitis without obstruction: Secondary | ICD-10-CM

## 2012-07-30 NOTE — Progress Notes (Signed)
Status post laparoscopic cholecystectomy with intraoperative cholangiogram on 07/14/12 for chronic crackles cholecystitis. The patient did reasonably well. He did not take any pain medicine after surgery. His bowel movements are alternating between constipation and diarrhea. He still gets occasional twinges of pain in his right upper quadrant but this seems to be better than prior to surgery.  On examination his skin is warm and dry with no sign of jaundice. His incisions all seem to be healing well no sign of infection. His point tenderness in his right upper quadrant seems to be at his port site. This may be due to 8 underlying intramuscular hematoma. We discussed using a fiber supplement and a probiotic to help regulate his bowel movements. If his symptoms continue then he will contact us for followup visit in 3-4 weeks.  Wilmon Arms. Corliss Skains, MD, [facility]  07/30/2012 9:59 AM

## 2012-07-30 NOTE — Patient Instructions (Signed)
Daily fiber supplement OTC probiotic (Align or generic equivalent)

## 2014-03-10 ENCOUNTER — Other Ambulatory Visit: Payer: Self-pay | Admitting: Family Medicine

## 2014-03-10 ENCOUNTER — Ambulatory Visit (INDEPENDENT_AMBULATORY_CARE_PROVIDER_SITE_OTHER): Payer: BC Managed Care – PPO

## 2014-03-10 ENCOUNTER — Ambulatory Visit (INDEPENDENT_AMBULATORY_CARE_PROVIDER_SITE_OTHER): Payer: BC Managed Care – PPO | Admitting: Family Medicine

## 2014-03-10 VITALS — BP 136/84 | HR 82 | Temp 98.7°F | Resp 16 | Ht 68.5 in | Wt 248.0 lb

## 2014-03-10 DIAGNOSIS — M25571 Pain in right ankle and joints of right foot: Secondary | ICD-10-CM

## 2014-03-10 DIAGNOSIS — S93401A Sprain of unspecified ligament of right ankle, initial encounter: Secondary | ICD-10-CM

## 2014-03-10 NOTE — Patient Instructions (Addendum)
Remove boot and do range of motion exercises 3-4 times a day Please follow up in 7-10 days   Ankle Sprain An ankle sprain is an injury to the strong, fibrous tissues (ligaments) that hold the bones of your ankle joint together.  CAUSES An ankle sprain is usually caused by a fall or by twisting your ankle. Ankle sprains most commonly occur when you step on the outer edge of your foot, and your ankle turns inward. People who participate in sports are more prone to these types of injuries.  SYMPTOMS   Pain in your ankle. The pain may be present at rest or only when you are trying to stand or walk.  Swelling.  Bruising. Bruising may develop immediately or within 1 to 2 days after your injury.  Difficulty standing or walking, particularly when turning corners or changing directions. DIAGNOSIS  Your caregiver will ask you details about your injury and perform a physical exam of your ankle to determine if you have an ankle sprain. During the physical exam, your caregiver will press on and apply pressure to specific areas of your foot and ankle. Your caregiver will try to move your ankle in certain ways. An X-ray exam may be done to be sure a bone was not broken or a ligament did not separate from one of the bones in your ankle (avulsion fracture).  TREATMENT  Certain types of braces can help stabilize your ankle. Your caregiver can make a recommendation for this. Your caregiver may recommend the use of medicine for pain. If your sprain is severe, your caregiver may refer you to a surgeon who helps to restore function to parts of your skeletal system (orthopedist) or a physical therapist. [place] ice to your injury for 1-2 days or as directed by your caregiver. Applying ice helps to reduce inflammation and pain.  Put ice in a plastic bag.  Place a towel between your skin and the bag.  Leave the ice on for 15-20 minutes at a time, every 2 hours while you are awake.  Only  take over-the-counter or prescription medicines for pain, discomfort, or fever as directed by your caregiver.  Elevate your injured ankle above the level of your heart as much as possible for 2-3 days.  If your caregiver recommends crutches, use them as instructed. Gradually put weight on the affected ankle. Continue to use crutches or a cane until you can walk without feeling pain in your ankle.  If you have a plaster splint, wear the splint as directed by your caregiver. Do not rest it on anything harder than a pillow for the first 24 hours. Do not put weight on it. Do not get it wet. You may take it off to take a shower or bath.  You may have been given an elastic bandage to wear around your ankle to provide support. If the elastic bandage is too tight (you have numbness or tingling in your foot or your foot becomes cold and blue), adjust the bandage to make it comfortable.  If you have an air splint, you may blow more air into it or let air out to make it more comfortable. You may take your splint off at night and before taking a shower or bath. Wiggle your toes in the splint several times per day to decrease swelling. SEEK MEDICAL CARE IF:   You have rapidly increasing bruising or swelling.  Your toes feel extremely cold or you lose feeling in your foot.  Your pain is not relieved with medicine. SEEK IMMEDIATE MEDICAL CARE IF:  Your toes are numb or blue.  You have severe pain that is increasing. MAKE SURE YOU:   Understand these instructions.  Will watch your condition.  Will get help right away if you are not doing well or get worse. Document Released: 05/19/2005 Document Revised: 02/11/2012 Document Reviewed: 05/31/2011 [facility] Patient Information 2015 [place], [place]. This information is not intended to replace advice given to you by your health care provider. Make sure you discuss any questions you have with your health care provider.

## 2014-03-10 NOTE — Progress Notes (Signed)
Subjective:    Patient ID: Jason Garrett, male    DOB: 09-Jan-1946, 68 y.o.   MRN: [id number]  HPI This is a very pleasant 68 yo male who presents today with right ankle pain. He was [place] the [place] ago, and while away from home, he awoke one morning with pain across the bottom of his right foot. He was always able to bear weight and his pain completely resolved with decreased activity Last night, he was lying in bed when he heard a popping in sound in his right ankle. The only thing out of the ordinary he remembers doing is lifting a box of books earlier in the evening. He has had pain on lateral ankle and maybe some swelling. It has been very uncomfortable for him to bear weight, and he has been using his father's cane. He has a history of mild renal impairment and does not take NSAIDs, he doesn't feel that tylenol is helpful. He has not used ice or heat to area.   He has DM2 and his blood sugar at home runs 100-110. He reports that he is very well controlled and has regular follow up with his PCP, Dr. Lorene Dy.  Past Medical History  Diagnosis Date  . Diabetes mellitus without complication   . Hypertension   . Hyperlipidemia   . Pneumonia 1996    walking pneumonia   Past Surgical History  Procedure Laterality Date  . Ulnar nerve repair  1997  . Hernia repair  1950  . Tonsillectomy    . Cholecystectomy N/A 07/14/2012    Procedure: LAPAROSCOPIC CHOLECYSTECTOMY WITH INTRAOPERATIVE CHOLANGIOGRAM;  Surgeon: Imogene Burn. Georgette Dover, MD;  Location: [facility];  Service: General;  Laterality: N/A;   Family History  Problem Relation Age of Onset  . Cancer Father     skin   History  Substance Use Topics  . Smoking status: Never Smoker   . Smokeless tobacco: Not on file  . Alcohol Use: Yes     Comment: socially-every 2-3 days-wine   Review of Systems No falls, no known trauma to area.     Objective:   Physical Exam  Vitals reviewed. Constitutional: He is  oriented to person, place, and time. He appears well-developed and well-nourished.  HENT:  Head: Normocephalic and atraumatic.  Eyes: Conjunctivae are normal.  Neck: Normal range of motion. Neck supple.  Cardiovascular: Normal rate.   Pulmonary/Chest: Effort normal.  Musculoskeletal: Normal range of motion.       Right ankle: He exhibits normal range of motion, no swelling, no ecchymosis, no deformity and normal pulse. Tenderness. Lateral malleolus and CF ligament tenderness found. No medial malleolus, no AITFL, no posterior TFL, no head of 5th metatarsal and no proximal fibula tenderness found. Achilles tendon normal.  Pain with flexion and inversion/eversion.  Neurological: He is alert and oriented to person, place, and time.  Skin: Skin is warm and dry.  Psychiatric: He has a normal mood and affect. His behavior is normal. Judgment and thought content normal.   Right Ankle Xray- [facility] reading (PRIMARY) by  Dr. Laney Pastor- ? Old avulsion injury at cuboid, chronic degenerative changes, no acute changes. .     Assessment & Plan:  1. Right ankle pain  2. Sprain of ankle, right, initial encounter -Provided written and verbal information regarding diagnosis and treatment. -Patient fitted with short cam walker- instructed to remove several times a day and perform ROM exercises. -Can use ice/heat for comfort -tylenol for  pain -follow up in 7-10 days, sooner if worsening.  Elby Beck, FNP-BC  [facility]  03/12/2014 3:27 PM

## 2014-03-18 ENCOUNTER — Ambulatory Visit (INDEPENDENT_AMBULATORY_CARE_PROVIDER_SITE_OTHER): Payer: BC Managed Care – PPO | Admitting: Family Medicine

## 2014-03-18 VITALS — BP 114/70 | HR 61 | Temp 98.4°F | Resp 18 | Ht 68.5 in | Wt 251.4 lb

## 2014-03-18 DIAGNOSIS — M25571 Pain in right ankle and joints of right foot: Secondary | ICD-10-CM

## 2014-03-18 DIAGNOSIS — S93401D Sprain of unspecified ligament of right ankle, subsequent encounter: Secondary | ICD-10-CM

## 2014-03-18 NOTE — Patient Instructions (Signed)
Use the "sweed-o" ankle support as needed for your ankle sprain.  You can wean off the support as tolerated.  Let me know if you do not continue to get better over the next couple of weeks

## 2014-03-18 NOTE — Progress Notes (Signed)
[facility] [address] [place] [address] [phone]  Date:  03/18/2014   Name:  Jason Garrett   DOB:  1946-02-26   MRN:  [id number]  PCP:  Myriam Jacobson, MD    Chief Complaint: Follow-up   History of Present Illness:  Jason Garrett is a 68 y.o. very pleasant male patient who presents with the following:  Here to recheck an ankle sprain. He was seen here about a week ago and had a negative x-ray, dx with right ankle sprain and placed in a short CAM for protection and comfort.  He notes that he is "much better. 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    Patient Active Problem List   Diagnosis Date Noted  . Chronic cholecystitis with calculus 06/14/2012    Past Medical History  Diagnosis Date  . Diabetes mellitus without complication   . Hypertension   . Hyperlipidemia   . Pneumonia 1996    walking pneumonia    Past Surgical History  Procedure Laterality Date  . Ulnar nerve repair  1997  . Hernia repair  1950  . Tonsillectomy    . Cholecystectomy N/A 07/14/2012    Procedure: LAPAROSCOPIC CHOLECYSTECTOMY WITH INTRAOPERATIVE CHOLANGIOGRAM;  Surgeon: Imogene Burn. Georgette Dover, MD;  Location: [facility];  Service: General;  Laterality: N/A;    History  Substance Use Topics  . Smoking status: Never Smoker   . Smokeless tobacco: Not on file  . Alcohol Use: Yes     Comment: socially-every 2-3 days-wine    Family History  Problem Relation Age of Onset  . Cancer Father     skin    Allergies  Allergen Reactions  . Prilocaine Hives    Medication list has been reviewed and updated.  Current Outpatient Prescriptions on File Prior to Visit  Medication Sig Dispense Refill  . aspirin 81 MG tablet Take 81 mg by mouth daily.      .  chlorthalidone (HYGROTON) 25 MG tablet Take 25 mg by mouth daily before breakfast.       . doxazosin (CARDURA) 1 MG tablet Take 1 mg by mouth 2 (two) times daily.       Marland Kitchen glimepiride (AMARYL) 2 MG tablet Take 1 mg by mouth daily before breakfast.       . losartan (COZAAR) 25 MG tablet Take 25 mg by mouth at bedtime.       . simvastatin (ZOCOR) 20 MG tablet Take 20 mg by mouth daily before breakfast.       . verapamil (COVERA HS) 240 MG (CO) 24 hr tablet Take 240 mg by mouth daily before breakfast.        No current facility-administered medications on file prior to visit.    Review of Systems:  As per HPI- otherwise negative.   Physical Examination: Filed Vitals:   03/18/14 1005  BP: 114/70  Pulse: 61  Temp: 98.4 F (36.9 C)  Resp: 18   Filed Vitals:   03/18/14 1005  Height: 5' 8.5" (1.74 m)  Weight: 251 lb 6.4 oz (114.034 kg)   Body mass index is 37.66 kg/(m^2). Ideal Body Weight: Weight in (lb) to have BMI = 25: 166.5  GEN: WDWN, NAD,  Non-toxic, A & O x 3, obese, looks well HEENT: Atraumatic, Normocephalic. Neck supple. No masses, No LAD. Ears and Nose: No external deformity. CV: RRR, No M/G/R. No JVD. No thrill. No extra heart sounds. PULM: CTA B, no wheezes, crackles, rhonchi. No retractions. No resp. distress. No accessory muscle use. EXTR: No c/c/e NEURO somewhat slow gait but appears even and is not favoring ankle PSYCH: Normally interactive. Conversant. Not depressed or anxious appearing.  Calm demeanor.  Right ankle: mild tenderness over the lateral ankle and minimal puffiness.  No redness or heat  Placed in a sweed-o support which felt good to him.    Assessment and Plan: Ankle sprain, right, subsequent encounter  Ankle pain, right  Ankle sprain, improved.  He would like to downgrade to a sweed-o as he is starting a new job and would rather not have to wear the boot on his first day.  He will use the sweed-o as needed and let me know if not continuing to  improve  Signed Lamar Blinks, MD

## 2014-07-26 ENCOUNTER — Other Ambulatory Visit: Payer: Self-pay | Admitting: Dermatology

## 2014-07-30 ENCOUNTER — Encounter (HOSPITAL_COMMUNITY): Payer: Self-pay | Admitting: *Deleted

## 2014-07-30 ENCOUNTER — Emergency Department (HOSPITAL_COMMUNITY)
Admission: EM | Admit: 2014-07-30 | Discharge: 2014-07-30 | Disposition: A | Discharge status: Home / Self Care | Payer: 59 | Source: Home / Self Care | Attending: Emergency Medicine | Admitting: Emergency Medicine

## 2014-07-30 DIAGNOSIS — S8991XA Unspecified injury of right lower leg, initial encounter: Secondary | ICD-10-CM

## 2014-07-30 MED ORDER — METHYLPREDNISOLONE ACETATE 40 MG/ML IJ SUSP
INTRAMUSCULAR | Status: AC
Start: 2014-07-30 — End: 2014-07-30
  Filled 2014-07-30: qty 1

## 2014-07-30 NOTE — ED Provider Notes (Signed)
CSN: [id number]     Arrival date & time 07/30/14  1714 History   First MD Initiated Contact with Patient 07/30/14 1858     Chief Complaint  Patient presents with  . Knee Pain   (Consider location/radiation/quality/duration/timing/severity/associated sxs/prior Treatment) HPI  He is a 69 year old man here for evaluation of right knee pain. He states he was walking up some stairs when he felt a pop in his right knee. The knee feels okay as long as he can keep it straight. Weightbearing is okay, again as long as the knee is straight. He has severe pain with even attempting to bend his knee. The knee has gradually become swollen. No specific injury. He does report some stiffness in the knee chronically, states it takes a few minutes to loosen it up in the mornings.  Past Medical History  Diagnosis Date  . Diabetes mellitus without complication   . Hypertension   . Hyperlipidemia   . Pneumonia 1996    walking pneumonia   Past Surgical History  Procedure Laterality Date  . Ulnar nerve repair  1997  . Hernia repair  1950  . Tonsillectomy    . Cholecystectomy N/A 07/14/2012    Procedure: LAPAROSCOPIC CHOLECYSTECTOMY WITH INTRAOPERATIVE CHOLANGIOGRAM;  Surgeon: Imogene Burn. Georgette Dover, MD;  Location: [facility];  Service: General;  Laterality: N/A;   Family History  Problem Relation Age of Onset  . Cancer Father     skin   History  Substance Use Topics  . Smoking status: Never Smoker   . Smokeless tobacco: Not on file  . Alcohol Use: Yes     Comment: socially-every 2-3 days-wine    Review of Systems As in history of present illness Allergies  Prilocaine  Home Medications   Prior to Admission medications   Medication Sig Start Date End Date Taking? Authorizing Provider  aspirin 81 MG tablet Take 81 mg by mouth daily.    Historical Provider, MD  chlorthalidone (HYGROTON) 25 MG tablet Take 25 mg by mouth daily before breakfast.     Historical Provider, MD  doxazosin (CARDURA) 1 MG tablet  Take 1 mg by mouth 2 (two) times daily.     Historical Provider, MD  glimepiride (AMARYL) 2 MG tablet Take 1 mg by mouth daily before breakfast.     Historical Provider, MD  losartan (COZAAR) 25 MG tablet Take 25 mg by mouth at bedtime.     Historical Provider, MD  simvastatin (ZOCOR) 20 MG tablet Take 20 mg by mouth daily before breakfast.     Historical Provider, MD  verapamil (COVERA HS) 240 MG (CO) 24 hr tablet Take 240 mg by mouth daily before breakfast.     Historical Provider, MD   BP 126/75 mmHg  Pulse 64  Temp(Src) 98.6 F (37 C) (Oral)  Resp 16  SpO2 96% Physical Exam  Constitutional: He is oriented to person, place, and time. He appears well-developed and well-nourished. No distress.  Cardiovascular: Normal rate.   Pulmonary/Chest: Effort normal.  Musculoskeletal:  Right knee: No erythema. Moderate joint effusion. No point tenderness. No joint laxity. Unable to assess range of motion due to extreme pain with any knee flexion.  Neurological: He is alert and oriented to person, place, and time.    ED Course  Injection of joint Date/Time: 07/30/2014 7:47 PM Performed by: Melony Overly Authorized by: Melony Overly Consent: Verbal consent obtained. Risks and benefits: risks, benefits and alternatives were discussed Consent given by: patient Patient understanding: patient states understanding  of the procedure being performed Patient identity confirmed: verbally with patient Time out: Immediately prior to procedure a "time out" was called to verify the correct patient, procedure, equipment, support staff and site/side marked as required. Local anesthesia used: yes (Cold spray) Patient tolerance: Patient tolerated the procedure well with no immediate complications Comments: Skin was prepped with alcohol. Cold spray was used for local anesthesia. 40 mg Depo-Medrol with 4 mL of 1% lidocaine was injected into the knee via a lateral approach. He tolerated the procedure well.    (including critical care time) Labs Review Labs Reviewed - No data to display  Imaging Review No results found.   MDM   1. Right knee injury, initial encounter    Cortisone injection done today. Recommend ice, elevation, ibuprofen. Knee immobilizer and crutches given. Follow-up with Dr. Percell Miller, orthopedic physician, in the next week.    Melony Overly, MD 07/30/14 [phone]

## 2014-07-30 NOTE — ED Notes (Signed)
C/o R knee pain while walking up 2 steps this afternoon.  No fall. States it snapped and wanted to give way but he caught himself.  Has applied ice.  Appears swollen.

## 2014-07-30 NOTE — Discharge Instructions (Signed)
We did a cortisone injection in your knee today. I am concerned that you tore your meniscus. Please apply ice as often as you can. Keep urine elevated as much as possible. Wear the knee immobilizer during the day. It is okay to take 400 mg of ibuprofen 2-3 times a day with mild liver problems. Please call the orthopedic doctor to set up an appointment for later this week.

## 2014-08-02 ENCOUNTER — Other Ambulatory Visit (HOSPITAL_COMMUNITY): Payer: Self-pay | Admitting: Orthopaedic Surgery

## 2014-08-02 DIAGNOSIS — M25561 Pain in right knee: Secondary | ICD-10-CM

## 2014-08-11 ENCOUNTER — Ambulatory Visit (HOSPITAL_COMMUNITY): Admission: RE | Admit: 2014-08-11 | Payer: 59 | Source: Ambulatory Visit

## 2015-06-03 MED FILL — LOSARTAN POTASSIUM 25 MG TA: 25 | 90 days supply | Qty: 90 | Fill #3

## 2015-06-07 DIAGNOSIS — L570 Actinic keratosis: Secondary | ICD-10-CM | POA: Diagnosis not present

## 2015-06-07 DIAGNOSIS — L738 Other specified follicular disorders: Secondary | ICD-10-CM | POA: Diagnosis not present

## 2015-06-07 MED FILL — FLUOROURACIL 5% CREAM: 5 | 28 days supply | Qty: 40 | Fill #0

## 2015-06-26 DIAGNOSIS — E7800 Pure hypercholesterolemia, unspecified: Secondary | ICD-10-CM | POA: Diagnosis not present

## 2015-06-26 DIAGNOSIS — Z1329 Encounter for screening for other suspected endocrine disorder: Secondary | ICD-10-CM | POA: Diagnosis not present

## 2015-06-26 DIAGNOSIS — Z1389 Encounter for screening for other disorder: Secondary | ICD-10-CM | POA: Diagnosis not present

## 2015-06-26 DIAGNOSIS — Z13220 Encounter for screening for lipoid disorders: Secondary | ICD-10-CM | POA: Diagnosis not present

## 2015-06-26 DIAGNOSIS — Z1212 Encounter for screening for malignant neoplasm of rectum: Secondary | ICD-10-CM | POA: Diagnosis not present

## 2015-06-26 DIAGNOSIS — Z131 Encounter for screening for diabetes mellitus: Secondary | ICD-10-CM | POA: Diagnosis not present

## 2015-06-26 DIAGNOSIS — Z0000 Encounter for general adult medical examination without abnormal findings: Secondary | ICD-10-CM | POA: Diagnosis not present

## 2015-06-26 DIAGNOSIS — Z0110 Encounter for examination of ears and hearing without abnormal findings: Secondary | ICD-10-CM | POA: Diagnosis not present

## 2015-06-26 DIAGNOSIS — Z0112 Encounter for hearing conservation and treatment: Secondary | ICD-10-CM | POA: Diagnosis not present

## 2015-06-26 DIAGNOSIS — Z125 Encounter for screening for malignant neoplasm of prostate: Secondary | ICD-10-CM | POA: Diagnosis not present

## 2015-06-26 DIAGNOSIS — Z6835 Body mass index (BMI) 35.0-35.9, adult: Secondary | ICD-10-CM | POA: Diagnosis not present

## 2015-06-26 DIAGNOSIS — I10 Essential (primary) hypertension: Secondary | ICD-10-CM | POA: Diagnosis not present

## 2015-06-26 DIAGNOSIS — Z136 Encounter for screening for cardiovascular disorders: Secondary | ICD-10-CM | POA: Diagnosis not present

## 2015-07-13 MED FILL — LEVITRA 20 MG TABLET: 20 | 15 days supply | Qty: 3 | Fill #0

## 2015-07-13 MED FILL — CHLORTHALIDONE 25 MG TABLET: 25 | 90 days supply | Qty: 90 | Fill #0

## 2015-08-03 MED FILL — VERAPAMIL ER 240 MG TABLET: 240 | 90 days supply | Qty: 90 | Fill #0

## 2015-08-03 MED FILL — GLIMEPIRIDE 2 MG TABLET: 2 | 90 days supply | Qty: 90 | Fill #0

## 2015-08-03 MED FILL — SIMVASTATIN 20 MG TABLET: 20 | 90 days supply | Qty: 90 | Fill #0

## 2015-08-30 MED FILL — DOXAZOSIN MESYLATE 1 MG TAB: 1 | 90 days supply | Qty: 90 | Fill #0

## 2015-09-04 DIAGNOSIS — L821 Other seborrheic keratosis: Secondary | ICD-10-CM | POA: Diagnosis not present

## 2015-09-04 DIAGNOSIS — L570 Actinic keratosis: Secondary | ICD-10-CM | POA: Diagnosis not present

## 2015-09-18 MED FILL — LOSARTAN POTASSIUM 25 MG TA: 25 | 90 days supply | Qty: 90 | Fill #0

## 2015-10-05 MED FILL — CHLORTHALIDONE 25 MG TABLET: 25 | 90 days supply | Qty: 90 | Fill #1

## 2015-10-30 DIAGNOSIS — E1165 Type 2 diabetes mellitus with hyperglycemia: Secondary | ICD-10-CM | POA: Diagnosis not present

## 2015-11-04 MED FILL — SIMVASTATIN 20 MG TABLET: 20 | 90 days supply | Qty: 90 | Fill #1

## 2015-11-07 MED FILL — GLIMEPIRIDE 2 MG TABLET: 2 | 90 days supply | Qty: 90 | Fill #1

## 2015-11-07 MED FILL — VERAPAMIL ER 240 MG TABLET: 240 | 90 days supply | Qty: 90 | Fill #1

## 2015-11-25 MED FILL — DOXAZOSIN MESYLATE 1 MG TAB: 1 | 90 days supply | Qty: 90 | Fill #1

## 2015-12-12 DIAGNOSIS — H2513 Age-related nuclear cataract, bilateral: Secondary | ICD-10-CM | POA: Diagnosis not present

## 2015-12-12 DIAGNOSIS — H4312 Vitreous hemorrhage, left eye: Secondary | ICD-10-CM | POA: Diagnosis not present

## 2015-12-12 DIAGNOSIS — H43812 Vitreous degeneration, left eye: Secondary | ICD-10-CM | POA: Diagnosis not present

## 2015-12-12 DIAGNOSIS — E119 Type 2 diabetes mellitus without complications: Secondary | ICD-10-CM | POA: Diagnosis not present

## 2015-12-12 DIAGNOSIS — H40013 Open angle with borderline findings, low risk, bilateral: Secondary | ICD-10-CM | POA: Diagnosis not present

## 2015-12-26 MED FILL — LOSARTAN POTASSIUM 25 MG TA: 25 | 90 days supply | Qty: 90 | Fill #1

## 2016-01-07 DIAGNOSIS — H43813 Vitreous degeneration, bilateral: Secondary | ICD-10-CM | POA: Diagnosis not present

## 2016-01-07 DIAGNOSIS — H2513 Age-related nuclear cataract, bilateral: Secondary | ICD-10-CM | POA: Diagnosis not present

## 2016-01-10 MED FILL — CHLORTHALIDONE 25 MG TABLET: 25 | 90 days supply | Qty: 90 | Fill #2

## 2016-02-04 MED FILL — VERAPAMIL ER 240 MG TABLET: 240 | 90 days supply | Qty: 90 | Fill #2

## 2016-02-04 MED FILL — SIMVASTATIN 20 MG TABLET: 20 | 90 days supply | Qty: 90 | Fill #2

## 2016-02-06 DIAGNOSIS — E119 Type 2 diabetes mellitus without complications: Secondary | ICD-10-CM | POA: Diagnosis not present

## 2016-02-06 DIAGNOSIS — H2513 Age-related nuclear cataract, bilateral: Secondary | ICD-10-CM | POA: Diagnosis not present

## 2016-02-06 DIAGNOSIS — H43813 Vitreous degeneration, bilateral: Secondary | ICD-10-CM | POA: Diagnosis not present

## 2016-02-14 DIAGNOSIS — D485 Neoplasm of uncertain behavior of skin: Secondary | ICD-10-CM | POA: Diagnosis not present

## 2016-02-14 MED FILL — CEPHALEXIN 500 MG CAPSULE: 500 | 10 days supply | Qty: 40 | Fill #0

## 2016-02-25 DIAGNOSIS — E1165 Type 2 diabetes mellitus with hyperglycemia: Secondary | ICD-10-CM | POA: Diagnosis not present

## 2016-02-25 MED FILL — DOXAZOSIN MESYLATE 1 MG TAB: 1 | 90 days supply | Qty: 90 | Fill #2

## 2016-03-06 DIAGNOSIS — D692 Other nonthrombocytopenic purpura: Secondary | ICD-10-CM | POA: Diagnosis not present

## 2016-03-06 DIAGNOSIS — L720 Epidermal cyst: Secondary | ICD-10-CM | POA: Diagnosis not present

## 2016-03-06 DIAGNOSIS — L570 Actinic keratosis: Secondary | ICD-10-CM | POA: Diagnosis not present

## 2016-04-05 MED FILL — LOSARTAN POTASSIUM 25 MG TA: 25 | 90 days supply | Qty: 90 | Fill #2

## 2016-04-05 MED FILL — CHLORTHALIDONE 25 MG TABLET: 25 | 90 days supply | Qty: 90 | Fill #3

## 2016-04-25 MED FILL — SIMVASTATIN 20 MG TABLET: 20 | 90 days supply | Qty: 90 | Fill #3

## 2016-04-28 IMAGING — CR DG ANKLE COMPLETE 3+V*R*
4 series · 4 of 4 positions shown · non-contrast
Comparison: None

CLINICAL DATA: RIGHT ankle pain laterally post fall

EXAM:
RIGHT ANKLE - COMPLETE 3+ VIEW

[AP]
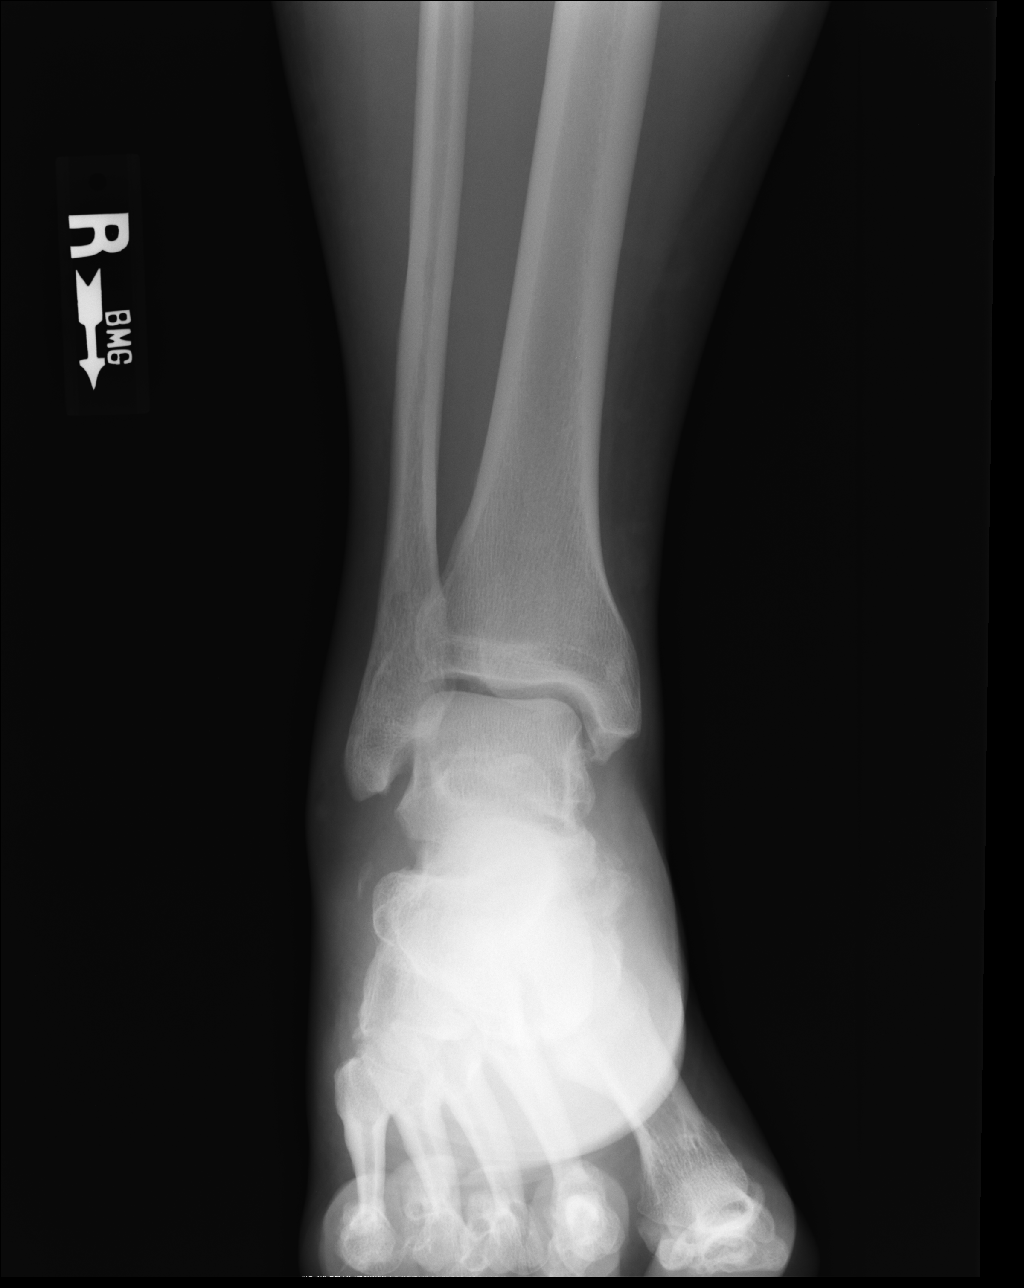

[ap obl int rot]
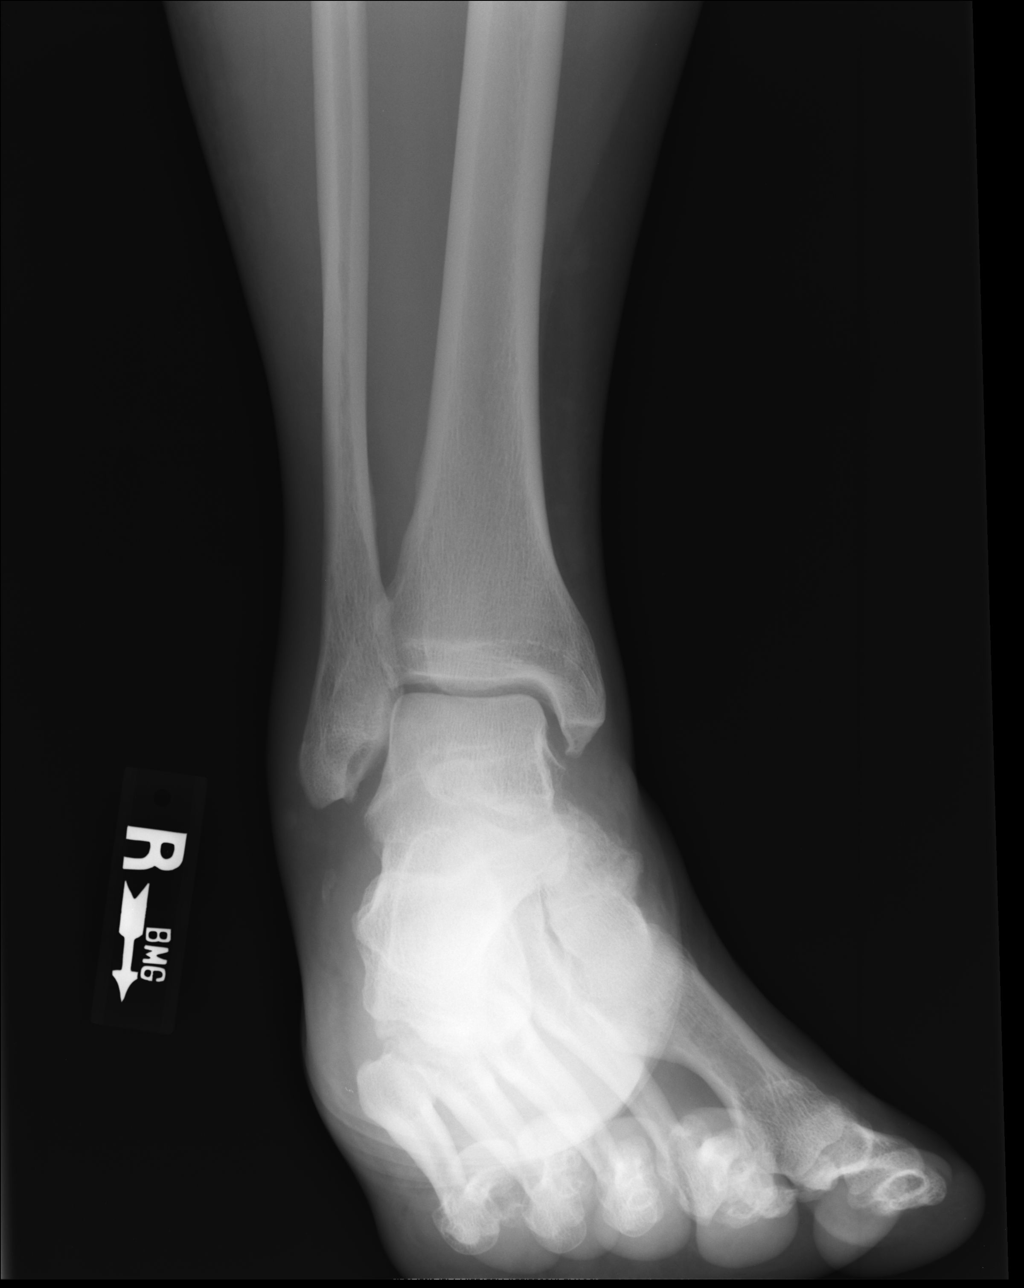

[medial obl]
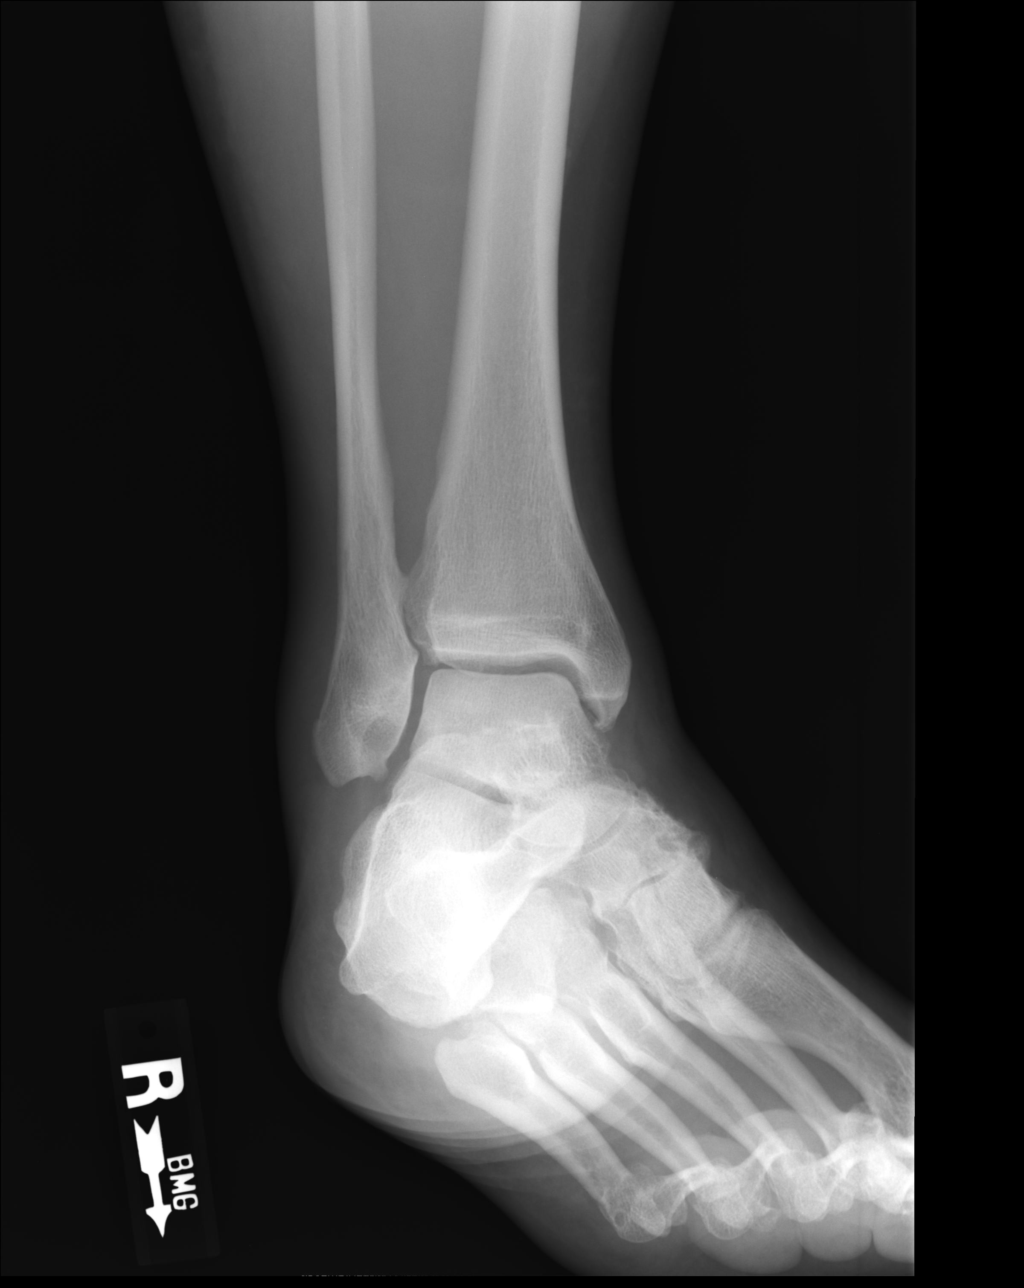

[lateral]
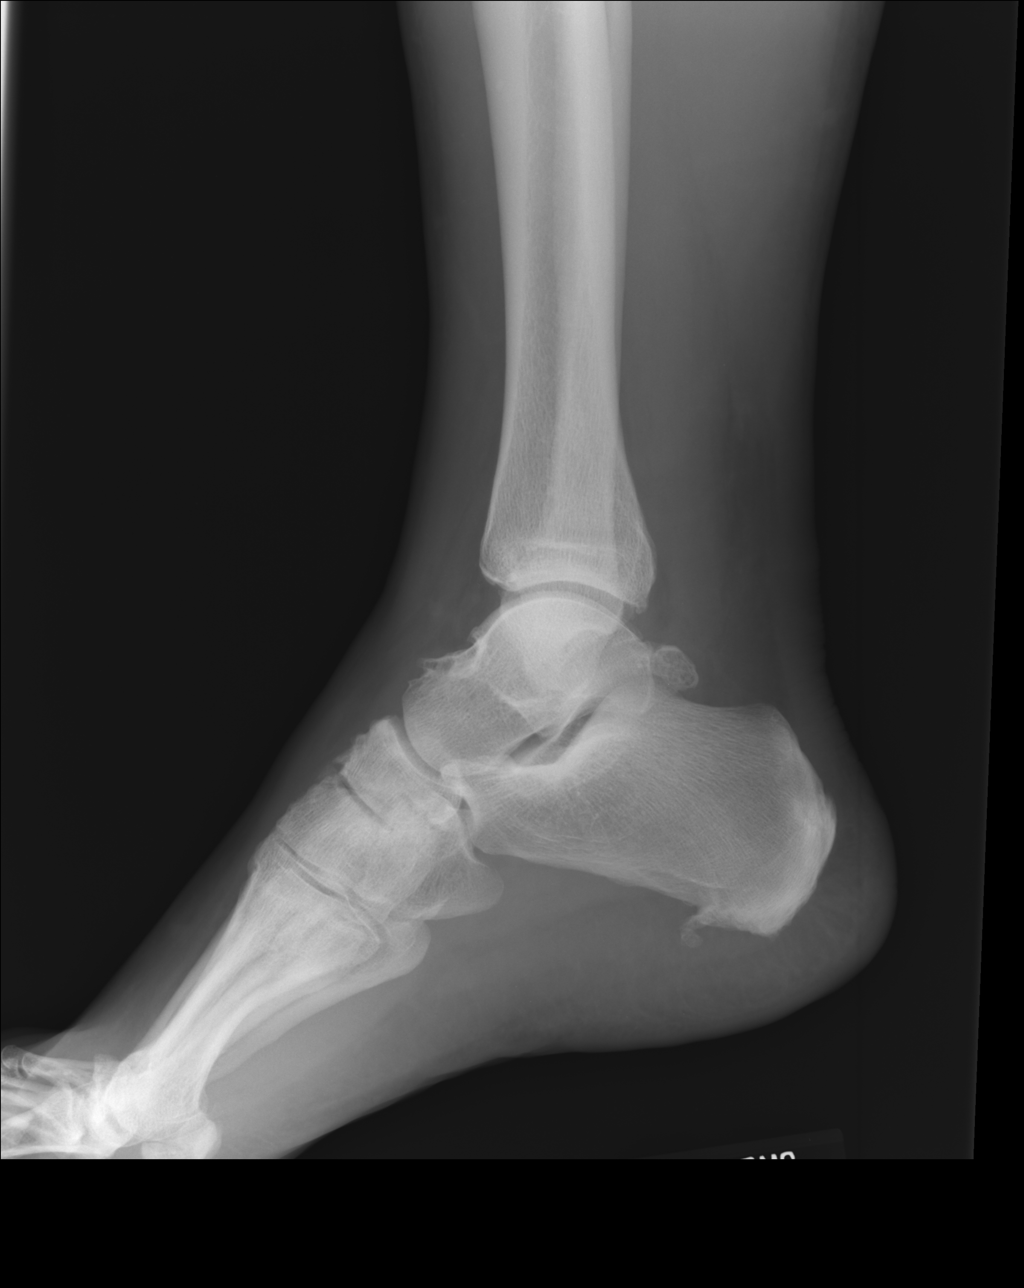

[4 of 4 positions shown; findings below may reference images not displayed]

FINDINGS: Osseous mineralization normal.

Soft tissue swelling greatest laterally.

Bone island at medial tibial metaphysis at base of medial malleolus.

Small soft tissue calcification laterally at the hindfoot,
nonspecific, appears old, could represent soft tissue calcification
or sequela of a remote displaced fracture fragment.

No acute fracture, dislocation or bone destruction.

Naviculocuneiform degenerative changes.

Plantar calcaneal spur.
IMPRESSION: No acute osseous abnormalities.

## 2016-05-06 MED FILL — VERAPAMIL ER 240 MG TABLET: 240 | 90 days supply | Qty: 90 | Fill #3

## 2016-05-30 MED FILL — DOXAZOSIN MESYLATE 1 MG TAB: 1 | 90 days supply | Qty: 90 | Fill #3

## 2016-06-25 DIAGNOSIS — E785 Hyperlipidemia, unspecified: Secondary | ICD-10-CM | POA: Diagnosis not present

## 2016-06-25 DIAGNOSIS — Z0000 Encounter for general adult medical examination without abnormal findings: Secondary | ICD-10-CM | POA: Diagnosis not present

## 2016-06-25 DIAGNOSIS — Z0110 Encounter for examination of ears and hearing without abnormal findings: Secondary | ICD-10-CM | POA: Diagnosis not present

## 2016-06-25 DIAGNOSIS — Z13220 Encounter for screening for lipoid disorders: Secondary | ICD-10-CM | POA: Diagnosis not present

## 2016-06-25 DIAGNOSIS — Z125 Encounter for screening for malignant neoplasm of prostate: Secondary | ICD-10-CM | POA: Diagnosis not present

## 2016-06-25 DIAGNOSIS — E1165 Type 2 diabetes mellitus with hyperglycemia: Secondary | ICD-10-CM | POA: Diagnosis not present

## 2016-06-25 DIAGNOSIS — Z1329 Encounter for screening for other suspected endocrine disorder: Secondary | ICD-10-CM | POA: Diagnosis not present

## 2016-06-25 DIAGNOSIS — Z1389 Encounter for screening for other disorder: Secondary | ICD-10-CM | POA: Diagnosis not present

## 2016-06-25 DIAGNOSIS — Z1211 Encounter for screening for malignant neoplasm of colon: Secondary | ICD-10-CM | POA: Diagnosis not present

## 2016-06-25 DIAGNOSIS — Z0112 Encounter for hearing conservation and treatment: Secondary | ICD-10-CM | POA: Diagnosis not present

## 2016-06-25 DIAGNOSIS — Z131 Encounter for screening for diabetes mellitus: Secondary | ICD-10-CM | POA: Diagnosis not present

## 2016-06-25 DIAGNOSIS — Z136 Encounter for screening for cardiovascular disorders: Secondary | ICD-10-CM | POA: Diagnosis not present

## 2016-07-05 DIAGNOSIS — Z7289 Other problems related to lifestyle: Secondary | ICD-10-CM | POA: Diagnosis not present

## 2016-07-09 MED FILL — CHLORTHALIDONE 25 MG TABLET: 25 | 90 days supply | Qty: 90 | Fill #0

## 2016-07-09 MED FILL — LOSARTAN POTASSIUM 25 MG TA: 25 | 90 days supply | Qty: 90 | Fill #0

## 2016-08-01 MED FILL — VERAPAMIL ER 240 MG TABLET: 240 | 90 days supply | Qty: 90 | Fill #0

## 2016-08-01 MED FILL — SIMVASTATIN 20 MG TABLET: 20 | 90 days supply | Qty: 90 | Fill #0

## 2016-08-01 MED FILL — GLIMEPIRIDE 2 MG TABLET: 2 | 90 days supply | Qty: 90 | Fill #0

## 2016-08-27 MED FILL — DOXAZOSIN MESYLATE 1 MG TAB: 1 | 90 days supply | Qty: 90 | Fill #0

## 2016-09-08 DIAGNOSIS — H2513 Age-related nuclear cataract, bilateral: Secondary | ICD-10-CM | POA: Diagnosis not present

## 2016-09-08 DIAGNOSIS — H43813 Vitreous degeneration, bilateral: Secondary | ICD-10-CM | POA: Diagnosis not present

## 2016-09-08 DIAGNOSIS — E119 Type 2 diabetes mellitus without complications: Secondary | ICD-10-CM | POA: Diagnosis not present

## 2016-10-08 MED FILL — LOSARTAN POTASSIUM 25 MG TA: 25 | 90 days supply | Qty: 90 | Fill #1

## 2016-10-20 DIAGNOSIS — D485 Neoplasm of uncertain behavior of skin: Secondary | ICD-10-CM | POA: Diagnosis not present

## 2016-10-20 DIAGNOSIS — E1165 Type 2 diabetes mellitus with hyperglycemia: Secondary | ICD-10-CM | POA: Diagnosis not present

## 2016-10-27 MED FILL — SIMVASTATIN 20 MG TABLET: 20 | 90 days supply | Qty: 90 | Fill #1

## 2016-11-07 MED FILL — VERAPAMIL ER 240 MG TABLET: 240 | 90 days supply | Qty: 90 | Fill #1

## 2016-11-08 DIAGNOSIS — E1165 Type 2 diabetes mellitus with hyperglycemia: Secondary | ICD-10-CM | POA: Diagnosis not present

## 2016-11-27 MED FILL — DOXAZOSIN MESYLATE 1 MG TAB: 1 | 90 days supply | Qty: 90 | Fill #1

## 2017-01-06 MED FILL — CHLORTHALIDONE 25 MG TABLET: 25 | 90 days supply | Qty: 90 | Fill #1

## 2017-01-11 MED FILL — LOSARTAN POTASSIUM 25 MG TA: 25 | 90 days supply | Qty: 90 | Fill #2

## 2017-01-28 MED FILL — SIMVASTATIN 20 MG TABLET: 20 | 90 days supply | Qty: 90 | Fill #2

## 2017-01-28 MED FILL — GLIMEPIRIDE 2 MG TABLET: 2 | 90 days supply | Qty: 90 | Fill #1

## 2017-02-16 DIAGNOSIS — E1165 Type 2 diabetes mellitus with hyperglycemia: Secondary | ICD-10-CM | POA: Diagnosis not present

## 2017-02-16 MED FILL — VERAPAMIL ER 240 MG TABLET: 240 | 90 days supply | Qty: 90 | Fill #2

## 2017-03-02 MED FILL — DOXAZOSIN MESYLATE TAB 1 MG: 1 | 90 days supply | Qty: 90 | Fill #2

## 2017-03-11 DIAGNOSIS — L814 Other melanin hyperpigmentation: Secondary | ICD-10-CM | POA: Diagnosis not present

## 2017-03-11 DIAGNOSIS — L570 Actinic keratosis: Secondary | ICD-10-CM | POA: Diagnosis not present

## 2017-03-11 DIAGNOSIS — D225 Melanocytic nevi of trunk: Secondary | ICD-10-CM | POA: Diagnosis not present

## 2017-03-11 DIAGNOSIS — D2372 Other benign neoplasm of skin of left lower limb, including hip: Secondary | ICD-10-CM | POA: Diagnosis not present

## 2017-03-11 DIAGNOSIS — L821 Other seborrheic keratosis: Secondary | ICD-10-CM | POA: Diagnosis not present

## 2017-03-11 DIAGNOSIS — D2271 Melanocytic nevi of right lower limb, including hip: Secondary | ICD-10-CM | POA: Diagnosis not present

## 2017-03-11 DIAGNOSIS — L723 Sebaceous cyst: Secondary | ICD-10-CM | POA: Diagnosis not present

## 2017-03-11 DIAGNOSIS — D485 Neoplasm of uncertain behavior of skin: Secondary | ICD-10-CM | POA: Diagnosis not present

## 2017-03-11 DIAGNOSIS — D2272 Melanocytic nevi of left lower limb, including hip: Secondary | ICD-10-CM | POA: Diagnosis not present

## 2017-04-26 MED FILL — SIMVASTATIN 20 MG TABLET: 20 | 90 days supply | Qty: 90 | Fill #3

## 2017-04-27 MED FILL — LOSARTAN POTASSIUM 25 MG TA: 25 | 90 days supply | Qty: 90 | Fill #3

## 2017-05-13 MED FILL — VERAPAMIL ER 240 MG TABLET: 240 | 90 days supply | Qty: 90 | Fill #3

## 2017-05-28 MED FILL — DOXAZOSIN MESYLATE TAB 1 MG: 1 | 90 days supply | Qty: 90 | Fill #3

## 2017-06-16 DIAGNOSIS — H43813 Vitreous degeneration, bilateral: Secondary | ICD-10-CM | POA: Diagnosis not present

## 2017-06-16 DIAGNOSIS — H2513 Age-related nuclear cataract, bilateral: Secondary | ICD-10-CM | POA: Diagnosis not present

## 2017-06-16 DIAGNOSIS — H40013 Open angle with borderline findings, low risk, bilateral: Secondary | ICD-10-CM | POA: Diagnosis not present

## 2017-06-16 DIAGNOSIS — E119 Type 2 diabetes mellitus without complications: Secondary | ICD-10-CM | POA: Diagnosis not present

## 2017-06-29 DIAGNOSIS — Z1329 Encounter for screening for other suspected endocrine disorder: Secondary | ICD-10-CM | POA: Diagnosis not present

## 2017-06-29 DIAGNOSIS — Z1211 Encounter for screening for malignant neoplasm of colon: Secondary | ICD-10-CM | POA: Diagnosis not present

## 2017-06-29 DIAGNOSIS — Z0000 Encounter for general adult medical examination without abnormal findings: Secondary | ICD-10-CM | POA: Diagnosis not present

## 2017-06-29 DIAGNOSIS — Z0112 Encounter for hearing conservation and treatment: Secondary | ICD-10-CM | POA: Diagnosis not present

## 2017-06-29 DIAGNOSIS — Z131 Encounter for screening for diabetes mellitus: Secondary | ICD-10-CM | POA: Diagnosis not present

## 2017-06-29 DIAGNOSIS — Z125 Encounter for screening for malignant neoplasm of prostate: Secondary | ICD-10-CM | POA: Diagnosis not present

## 2017-06-29 DIAGNOSIS — E1165 Type 2 diabetes mellitus with hyperglycemia: Secondary | ICD-10-CM | POA: Diagnosis not present

## 2017-06-29 DIAGNOSIS — Z6835 Body mass index (BMI) 35.0-35.9, adult: Secondary | ICD-10-CM | POA: Diagnosis not present

## 2017-06-29 DIAGNOSIS — Z1331 Encounter for screening for depression: Secondary | ICD-10-CM | POA: Diagnosis not present

## 2017-06-29 DIAGNOSIS — Z23 Encounter for immunization: Secondary | ICD-10-CM | POA: Diagnosis not present

## 2017-06-29 DIAGNOSIS — Z136 Encounter for screening for cardiovascular disorders: Secondary | ICD-10-CM | POA: Diagnosis not present

## 2017-06-29 DIAGNOSIS — Z13220 Encounter for screening for lipoid disorders: Secondary | ICD-10-CM | POA: Diagnosis not present

## 2017-06-29 DIAGNOSIS — Z1389 Encounter for screening for other disorder: Secondary | ICD-10-CM | POA: Diagnosis not present

## 2017-07-09 MED FILL — CHLORTHALIDONE 25 MG TAB: 25 | 90 days supply | Qty: 90 | Fill #0

## 2017-08-03 MED FILL — GLIMEPIRIDE 2 MG TABLET: 2 | 90 days supply | Qty: 90 | Fill #0

## 2017-08-03 MED FILL — SHINGRIX 50 MCG SUS: 50 | 1 days supply | Qty: 1 | Fill #0

## 2017-08-03 MED FILL — SIMVASTATIN 20 MG TABS: 20 | 90 days supply | Qty: 90 | Fill #0

## 2017-08-03 MED FILL — LOSARTAN POTASSIUM 25 MG TA: 25 | 90 days supply | Qty: 90 | Fill #0

## 2017-08-12 MED FILL — VERAPAMIL ER 240 MG TABLET: 240 | 90 days supply | Qty: 90 | Fill #0

## 2017-09-09 MED FILL — DOXAZOSIN MESYLATE TAB 1 MG: 1 | 90 days supply | Qty: 90 | Fill #0

## 2017-09-28 MED FILL — SHINGRIX 50 MCG SUS: 50 | 1 days supply | Qty: 1 | Fill #1

## 2017-10-22 MED FILL — CHLORTHALIDONE 25 MG TAB: 25 | 90 days supply | Qty: 90 | Fill #1

## 2017-11-02 DIAGNOSIS — E1165 Type 2 diabetes mellitus with hyperglycemia: Secondary | ICD-10-CM | POA: Diagnosis not present

## 2017-11-05 MED FILL — LOSARTAN POTASSIUM 25 MG TA: 25 | 90 days supply | Qty: 90 | Fill #1

## 2017-11-05 MED FILL — SIMVASTATIN 20 MG TABS: 20 | 90 days supply | Qty: 90 | Fill #1

## 2017-11-05 MED FILL — VERAPAMIL ER 240 MG TABLET: 240 | 90 days supply | Qty: 90 | Fill #1

## 2017-12-15 MED FILL — DOXAZOSIN MESYLATE TAB 1 MG: 1 | 90 days supply | Qty: 90 | Fill #1

## 2018-01-25 MED FILL — CHLORTHALIDONE 25 MG TABS: 25 | 90 days supply | Qty: 90 | Fill #2

## 2018-02-02 MED FILL — SIMVASTATIN 20 MG TABLET: 20 | 90 days supply | Qty: 90 | Fill #2

## 2018-02-02 MED FILL — GLIMEPIRIDE 2 MG TABS: 2 | 90 days supply | Qty: 90 | Fill #1

## 2018-02-17 MED FILL — LOSARTAN POTASSIUM 25 MG TA: 25 | 30 days supply | Qty: 30 | Fill #2

## 2018-02-22 MED FILL — VERAPAMIL HCL ER 240 MG TBC: 240 | 90 days supply | Qty: 90 | Fill #2

## 2018-03-10 DIAGNOSIS — E1165 Type 2 diabetes mellitus with hyperglycemia: Secondary | ICD-10-CM | POA: Diagnosis not present

## 2018-03-19 MED FILL — DOXAZOSIN MESYLATE TAB 1 MG: 1 | 90 days supply | Qty: 90 | Fill #2

## 2018-03-19 MED FILL — LOSARTAN POTASSIUM 25 MG TA: 25 | 30 days supply | Qty: 30 | Fill #3

## 2018-03-24 DIAGNOSIS — M5412 Radiculopathy, cervical region: Secondary | ICD-10-CM | POA: Diagnosis not present

## 2018-03-24 DIAGNOSIS — M6528 Calcific tendinitis, other site: Secondary | ICD-10-CM | POA: Diagnosis not present

## 2018-03-24 MED FILL — predniSONE 10 MG TABS: 10 | 15 days supply | Qty: 30 | Fill #0

## 2018-04-18 MED FILL — LOSARTAN POTASSIUM 25 MG TA: 25 | 30 days supply | Qty: 30 | Fill #4

## 2018-04-21 DIAGNOSIS — M25511 Pain in right shoulder: Secondary | ICD-10-CM | POA: Diagnosis not present

## 2018-04-21 DIAGNOSIS — M542 Cervicalgia: Secondary | ICD-10-CM | POA: Diagnosis not present

## 2018-04-27 DIAGNOSIS — M542 Cervicalgia: Secondary | ICD-10-CM | POA: Diagnosis not present

## 2018-05-05 MED FILL — CHLORTHALIDONE 25 MG TABS: 25 | 90 days supply | Qty: 90 | Fill #3

## 2018-05-09 MED FILL — SIMVASTATIN 20 MG TABLET: 20 | 90 days supply | Qty: 90 | Fill #3

## 2018-05-18 DIAGNOSIS — M542 Cervicalgia: Secondary | ICD-10-CM | POA: Diagnosis not present

## 2018-05-24 MED FILL — LOSARTAN POTASSIUM 25 MG TA: 25 | 30 days supply | Qty: 30 | Fill #5

## 2018-05-24 MED FILL — VERAPAMIL HCL ER 240 MG TBC: 240 | 90 days supply | Qty: 90 | Fill #3

## 2018-05-27 DIAGNOSIS — M79601 Pain in right arm: Secondary | ICD-10-CM | POA: Diagnosis not present

## 2018-05-27 DIAGNOSIS — M5413 Radiculopathy, cervicothoracic region: Secondary | ICD-10-CM | POA: Diagnosis not present

## 2018-05-27 DIAGNOSIS — M79602 Pain in left arm: Secondary | ICD-10-CM | POA: Diagnosis not present

## 2018-05-27 DIAGNOSIS — M542 Cervicalgia: Secondary | ICD-10-CM | POA: Diagnosis not present

## 2018-06-10 DIAGNOSIS — M542 Cervicalgia: Secondary | ICD-10-CM | POA: Diagnosis not present

## 2018-06-10 DIAGNOSIS — M79601 Pain in right arm: Secondary | ICD-10-CM | POA: Diagnosis not present

## 2018-06-10 DIAGNOSIS — M79602 Pain in left arm: Secondary | ICD-10-CM | POA: Diagnosis not present

## 2018-06-10 DIAGNOSIS — M5413 Radiculopathy, cervicothoracic region: Secondary | ICD-10-CM | POA: Diagnosis not present

## 2018-06-16 DIAGNOSIS — H43813 Vitreous degeneration, bilateral: Secondary | ICD-10-CM | POA: Diagnosis not present

## 2018-06-16 DIAGNOSIS — E119 Type 2 diabetes mellitus without complications: Secondary | ICD-10-CM | POA: Diagnosis not present

## 2018-06-16 DIAGNOSIS — H2513 Age-related nuclear cataract, bilateral: Secondary | ICD-10-CM | POA: Diagnosis not present

## 2018-06-16 DIAGNOSIS — H40013 Open angle with borderline findings, low risk, bilateral: Secondary | ICD-10-CM | POA: Diagnosis not present

## 2018-06-17 DIAGNOSIS — M5413 Radiculopathy, cervicothoracic region: Secondary | ICD-10-CM | POA: Diagnosis not present

## 2018-06-17 DIAGNOSIS — M79601 Pain in right arm: Secondary | ICD-10-CM | POA: Diagnosis not present

## 2018-06-17 DIAGNOSIS — M542 Cervicalgia: Secondary | ICD-10-CM | POA: Diagnosis not present

## 2018-06-17 DIAGNOSIS — M79602 Pain in left arm: Secondary | ICD-10-CM | POA: Diagnosis not present

## 2018-06-22 MED FILL — DOXAZOSIN MESYLATE TAB 1 MG: 1 | 90 days supply | Qty: 90 | Fill #3

## 2018-06-29 DIAGNOSIS — H9113 Presbycusis, bilateral: Secondary | ICD-10-CM | POA: Diagnosis not present

## 2018-06-29 DIAGNOSIS — Z131 Encounter for screening for diabetes mellitus: Secondary | ICD-10-CM | POA: Diagnosis not present

## 2018-06-29 DIAGNOSIS — E785 Hyperlipidemia, unspecified: Secondary | ICD-10-CM | POA: Diagnosis not present

## 2018-06-29 DIAGNOSIS — R42 Dizziness and giddiness: Secondary | ICD-10-CM | POA: Diagnosis not present

## 2018-06-29 DIAGNOSIS — Z6835 Body mass index (BMI) 35.0-35.9, adult: Secondary | ICD-10-CM | POA: Diagnosis not present

## 2018-06-29 DIAGNOSIS — Z1211 Encounter for screening for malignant neoplasm of colon: Secondary | ICD-10-CM | POA: Diagnosis not present

## 2018-06-29 DIAGNOSIS — E1165 Type 2 diabetes mellitus with hyperglycemia: Secondary | ICD-10-CM | POA: Diagnosis not present

## 2018-06-29 DIAGNOSIS — Z125 Encounter for screening for malignant neoplasm of prostate: Secondary | ICD-10-CM | POA: Diagnosis not present

## 2018-06-29 DIAGNOSIS — I70219 Atherosclerosis of native arteries of extremities with intermittent claudication, unspecified extremity: Secondary | ICD-10-CM | POA: Diagnosis not present

## 2018-06-29 DIAGNOSIS — Z0000 Encounter for general adult medical examination without abnormal findings: Secondary | ICD-10-CM | POA: Diagnosis not present

## 2018-07-01 DIAGNOSIS — M79601 Pain in right arm: Secondary | ICD-10-CM | POA: Diagnosis not present

## 2018-07-01 DIAGNOSIS — M542 Cervicalgia: Secondary | ICD-10-CM | POA: Diagnosis not present

## 2018-07-01 DIAGNOSIS — M79602 Pain in left arm: Secondary | ICD-10-CM | POA: Diagnosis not present

## 2018-07-01 DIAGNOSIS — M5413 Radiculopathy, cervicothoracic region: Secondary | ICD-10-CM | POA: Diagnosis not present

## 2018-07-02 MED FILL — LOSARTAN POTASSIUM 25 MG TA: 25 | 30 days supply | Qty: 30 | Fill #0

## 2018-07-06 DIAGNOSIS — M5413 Radiculopathy, cervicothoracic region: Secondary | ICD-10-CM | POA: Diagnosis not present

## 2018-07-06 DIAGNOSIS — M542 Cervicalgia: Secondary | ICD-10-CM | POA: Diagnosis not present

## 2018-07-07 DIAGNOSIS — L821 Other seborrheic keratosis: Secondary | ICD-10-CM | POA: Diagnosis not present

## 2018-07-07 DIAGNOSIS — L723 Sebaceous cyst: Secondary | ICD-10-CM | POA: Diagnosis not present

## 2018-07-07 DIAGNOSIS — D2261 Melanocytic nevi of right upper limb, including shoulder: Secondary | ICD-10-CM | POA: Diagnosis not present

## 2018-07-07 DIAGNOSIS — D2372 Other benign neoplasm of skin of left lower limb, including hip: Secondary | ICD-10-CM | POA: Diagnosis not present

## 2018-07-07 DIAGNOSIS — D2272 Melanocytic nevi of left lower limb, including hip: Secondary | ICD-10-CM | POA: Diagnosis not present

## 2018-07-07 DIAGNOSIS — D2262 Melanocytic nevi of left upper limb, including shoulder: Secondary | ICD-10-CM | POA: Diagnosis not present

## 2018-07-07 DIAGNOSIS — D235 Other benign neoplasm of skin of trunk: Secondary | ICD-10-CM | POA: Diagnosis not present

## 2018-07-07 DIAGNOSIS — Z85828 Personal history of other malignant neoplasm of skin: Secondary | ICD-10-CM | POA: Diagnosis not present

## 2018-07-07 DIAGNOSIS — L570 Actinic keratosis: Secondary | ICD-10-CM | POA: Diagnosis not present

## 2018-07-08 DIAGNOSIS — M5413 Radiculopathy, cervicothoracic region: Secondary | ICD-10-CM | POA: Diagnosis not present

## 2018-07-08 DIAGNOSIS — M79601 Pain in right arm: Secondary | ICD-10-CM | POA: Diagnosis not present

## 2018-07-08 DIAGNOSIS — M542 Cervicalgia: Secondary | ICD-10-CM | POA: Diagnosis not present

## 2018-07-08 DIAGNOSIS — M79602 Pain in left arm: Secondary | ICD-10-CM | POA: Diagnosis not present

## 2018-07-13 DIAGNOSIS — Z131 Encounter for screening for diabetes mellitus: Secondary | ICD-10-CM | POA: Diagnosis not present

## 2018-07-13 DIAGNOSIS — R5383 Other fatigue: Secondary | ICD-10-CM | POA: Diagnosis not present

## 2018-07-13 DIAGNOSIS — Z79899 Other long term (current) drug therapy: Secondary | ICD-10-CM | POA: Diagnosis not present

## 2018-07-13 DIAGNOSIS — E7800 Pure hypercholesterolemia, unspecified: Secondary | ICD-10-CM | POA: Diagnosis not present

## 2018-07-13 DIAGNOSIS — E039 Hypothyroidism, unspecified: Secondary | ICD-10-CM | POA: Diagnosis not present

## 2018-07-13 DIAGNOSIS — Z125 Encounter for screening for malignant neoplasm of prostate: Secondary | ICD-10-CM | POA: Diagnosis not present

## 2018-07-15 DIAGNOSIS — M79602 Pain in left arm: Secondary | ICD-10-CM | POA: Diagnosis not present

## 2018-07-15 DIAGNOSIS — M5413 Radiculopathy, cervicothoracic region: Secondary | ICD-10-CM | POA: Diagnosis not present

## 2018-07-15 DIAGNOSIS — M79601 Pain in right arm: Secondary | ICD-10-CM | POA: Diagnosis not present

## 2018-07-15 DIAGNOSIS — M542 Cervicalgia: Secondary | ICD-10-CM | POA: Diagnosis not present

## 2018-07-22 DIAGNOSIS — M542 Cervicalgia: Secondary | ICD-10-CM | POA: Diagnosis not present

## 2018-07-22 DIAGNOSIS — M79601 Pain in right arm: Secondary | ICD-10-CM | POA: Diagnosis not present

## 2018-07-22 DIAGNOSIS — M79602 Pain in left arm: Secondary | ICD-10-CM | POA: Diagnosis not present

## 2018-07-22 DIAGNOSIS — M5413 Radiculopathy, cervicothoracic region: Secondary | ICD-10-CM | POA: Diagnosis not present

## 2018-08-03 MED FILL — CHLORTHALIDONE 25 MG TABS: 25 | 90 days supply | Qty: 90 | Fill #0

## 2018-08-05 DIAGNOSIS — M79601 Pain in right arm: Secondary | ICD-10-CM | POA: Diagnosis not present

## 2018-08-05 DIAGNOSIS — M5413 Radiculopathy, cervicothoracic region: Secondary | ICD-10-CM | POA: Diagnosis not present

## 2018-08-05 DIAGNOSIS — M542 Cervicalgia: Secondary | ICD-10-CM | POA: Diagnosis not present

## 2018-08-05 DIAGNOSIS — M79602 Pain in left arm: Secondary | ICD-10-CM | POA: Diagnosis not present

## 2018-08-10 MED FILL — GLIMEPIRIDE 2 MG TABLET: 2 | 90 days supply | Qty: 90 | Fill #0

## 2018-08-10 MED FILL — SIMVASTATIN 20 MG TABLET: 20 | 90 days supply | Qty: 90 | Fill #0

## 2018-08-10 MED FILL — LOSARTAN POTASSIUM 25 MG TA: 25 | 30 days supply | Qty: 30 | Fill #1

## 2018-08-19 MED FILL — LOSARTAN POTASSIUM 25 MG TA: 25 | 30 days supply | Qty: 30 | Fill #2

## 2018-08-19 MED FILL — VERAPAMIL HCL ER 240 MG TBC: 240 | 90 days supply | Qty: 90 | Fill #0

## 2018-09-06 MED FILL — LOSARTAN POTASSIUM 25 MG TA: 25 | 30 days supply | Qty: 30 | Fill #3

## 2018-09-06 MED FILL — DOXAZOSIN MESYLATE 1 MG TAB: 1 | 90 days supply | Qty: 180 | Fill #0

## 2018-10-06 MED FILL — LOSARTAN POTASSIUM 25 MG TA: 25 | 30 days supply | Qty: 30 | Fill #4

## 2018-10-26 MED FILL — CHLORTHALIDONE 25 MG TABLET: 25 | 90 days supply | Qty: 90 | Fill #1

## 2018-10-27 DIAGNOSIS — E1165 Type 2 diabetes mellitus with hyperglycemia: Secondary | ICD-10-CM | POA: Diagnosis not present

## 2018-11-05 MED FILL — LOSARTAN POTASSIUM 25 MG TA: 25 | 30 days supply | Qty: 30 | Fill #5

## 2018-11-08 MED FILL — SIMVASTATIN 20 MG TABLET: 20 | 90 days supply | Qty: 90 | Fill #1

## 2018-11-15 MED FILL — VERAPAMIL HCL ER 240 MG TBC: 240 | 90 days supply | Qty: 90 | Fill #1

## 2018-11-29 MED FILL — DOXAZOSIN MESYLATE 1 MG TAB: 1 | 90 days supply | Qty: 180 | Fill #1

## 2018-12-03 MED FILL — LOSARTAN POTASSIUM 25 MG TA: 25 | 30 days supply | Qty: 30 | Fill #6

## 2019-01-03 MED FILL — LOSARTAN POTASSIUM 25 MG TA: 25 | 30 days supply | Qty: 30 | Fill #7

## 2019-01-31 MED FILL — GLIMEPIRIDE 2 MG TABLET: 2 | 90 days supply | Qty: 90 | Fill #1

## 2019-01-31 MED FILL — CHLORTHALIDONE 25 MG TABS: 25 | 90 days supply | Qty: 90 | Fill #2

## 2019-02-02 MED FILL — LOSARTAN POTASSIUM 25 MG TA: 25 | 30 days supply | Qty: 30 | Fill #8

## 2019-02-11 MED FILL — SIMVASTATIN 20 MG TABLET: 20 | 90 days supply | Qty: 90 | Fill #2

## 2019-02-14 MED FILL — VERAPAMIL HCL ER 240 MG TBC: 240 | 90 days supply | Qty: 90 | Fill #2

## 2019-03-01 DIAGNOSIS — E1165 Type 2 diabetes mellitus with hyperglycemia: Secondary | ICD-10-CM | POA: Diagnosis not present

## 2019-03-02 MED FILL — DOXAZOSIN MESYLATE TAB 1 MG: 1 | 90 days supply | Qty: 180 | Fill #2

## 2019-03-04 MED FILL — LOSARTAN POTASSIUM 25 MG TA: 25 | 30 days supply | Qty: 30 | Fill #9

## 2019-04-03 MED FILL — LOSARTAN POTASSIUM 25 MG TA: 25 | 30 days supply | Qty: 30 | Fill #10

## 2019-05-03 MED FILL — LOSARTAN POTASSIUM 25 MG TA: 25 | 30 days supply | Qty: 30 | Fill #11

## 2019-05-08 MED FILL — SIMVASTATIN 20 MG TABLET: 20 | 90 days supply | Qty: 90 | Fill #3

## 2019-05-08 MED FILL — CHLORTHALIDONE 25 MG TABS: 25 | 90 days supply | Qty: 90 | Fill #3

## 2019-05-22 MED FILL — VERAPAMIL HCL ER 240 MG TBC: 240 | 90 days supply | Qty: 90 | Fill #3

## 2019-06-22 DIAGNOSIS — H43813 Vitreous degeneration, bilateral: Secondary | ICD-10-CM | POA: Diagnosis not present

## 2019-06-22 DIAGNOSIS — H40013 Open angle with borderline findings, low risk, bilateral: Secondary | ICD-10-CM | POA: Diagnosis not present

## 2019-06-22 DIAGNOSIS — H2513 Age-related nuclear cataract, bilateral: Secondary | ICD-10-CM | POA: Diagnosis not present

## 2019-06-22 DIAGNOSIS — E119 Type 2 diabetes mellitus without complications: Secondary | ICD-10-CM | POA: Diagnosis not present

## 2019-07-05 ENCOUNTER — Other Ambulatory Visit (HOSPITAL_COMMUNITY): Payer: Self-pay | Admitting: Internal Medicine

## 2019-07-05 DIAGNOSIS — Z1331 Encounter for screening for depression: Secondary | ICD-10-CM | POA: Diagnosis not present

## 2019-07-05 DIAGNOSIS — H9113 Presbycusis, bilateral: Secondary | ICD-10-CM | POA: Diagnosis not present

## 2019-07-05 DIAGNOSIS — R1032 Left lower quadrant pain: Secondary | ICD-10-CM | POA: Diagnosis not present

## 2019-07-05 DIAGNOSIS — R002 Palpitations: Secondary | ICD-10-CM | POA: Diagnosis not present

## 2019-07-05 DIAGNOSIS — E7800 Pure hypercholesterolemia, unspecified: Secondary | ICD-10-CM | POA: Diagnosis not present

## 2019-07-05 DIAGNOSIS — E039 Hypothyroidism, unspecified: Secondary | ICD-10-CM | POA: Diagnosis not present

## 2019-07-05 DIAGNOSIS — R42 Dizziness and giddiness: Secondary | ICD-10-CM | POA: Diagnosis not present

## 2019-07-05 DIAGNOSIS — E785 Hyperlipidemia, unspecified: Secondary | ICD-10-CM | POA: Diagnosis not present

## 2019-07-05 DIAGNOSIS — Z79899 Other long term (current) drug therapy: Secondary | ICD-10-CM | POA: Diagnosis not present

## 2019-07-05 DIAGNOSIS — Z0000 Encounter for general adult medical examination without abnormal findings: Secondary | ICD-10-CM | POA: Diagnosis not present

## 2019-07-05 DIAGNOSIS — Z1389 Encounter for screening for other disorder: Secondary | ICD-10-CM | POA: Diagnosis not present

## 2019-07-05 DIAGNOSIS — D485 Neoplasm of uncertain behavior of skin: Secondary | ICD-10-CM | POA: Diagnosis not present

## 2019-07-05 DIAGNOSIS — I70203 Unspecified atherosclerosis of native arteries of extremities, bilateral legs: Secondary | ICD-10-CM | POA: Diagnosis not present

## 2019-07-05 DIAGNOSIS — Z6833 Body mass index (BMI) 33.0-33.9, adult: Secondary | ICD-10-CM | POA: Diagnosis not present

## 2019-07-05 DIAGNOSIS — Z1211 Encounter for screening for malignant neoplasm of colon: Secondary | ICD-10-CM | POA: Diagnosis not present

## 2019-07-05 DIAGNOSIS — Z131 Encounter for screening for diabetes mellitus: Secondary | ICD-10-CM | POA: Diagnosis not present

## 2019-07-05 DIAGNOSIS — I10 Essential (primary) hypertension: Secondary | ICD-10-CM | POA: Diagnosis not present

## 2019-07-05 DIAGNOSIS — R5383 Other fatigue: Secondary | ICD-10-CM | POA: Diagnosis not present

## 2019-07-05 DIAGNOSIS — Z125 Encounter for screening for malignant neoplasm of prostate: Secondary | ICD-10-CM | POA: Diagnosis not present

## 2019-07-14 DIAGNOSIS — L738 Other specified follicular disorders: Secondary | ICD-10-CM | POA: Diagnosis not present

## 2019-07-14 DIAGNOSIS — Q828 Other specified congenital malformations of skin: Secondary | ICD-10-CM | POA: Diagnosis not present

## 2019-07-14 DIAGNOSIS — Z85828 Personal history of other malignant neoplasm of skin: Secondary | ICD-10-CM | POA: Diagnosis not present

## 2019-07-14 DIAGNOSIS — D1801 Hemangioma of skin and subcutaneous tissue: Secondary | ICD-10-CM | POA: Diagnosis not present

## 2019-07-14 DIAGNOSIS — D225 Melanocytic nevi of trunk: Secondary | ICD-10-CM | POA: Diagnosis not present

## 2019-07-14 DIAGNOSIS — D235 Other benign neoplasm of skin of trunk: Secondary | ICD-10-CM | POA: Diagnosis not present

## 2019-07-14 DIAGNOSIS — L570 Actinic keratosis: Secondary | ICD-10-CM | POA: Diagnosis not present

## 2019-07-14 DIAGNOSIS — D485 Neoplasm of uncertain behavior of skin: Secondary | ICD-10-CM | POA: Diagnosis not present

## 2019-07-14 MED FILL — IMIQUIMOD 5% CREAM PACKET: 5 | 30 days supply | Qty: 24 | Fill #0

## 2019-07-14 MED FILL — TADALAFIL 20 MG TABS: 20 | 20 days supply | Qty: 20 | Fill #0

## 2019-08-01 MED FILL — LOSARTAN POTASSIUM 25 MG TA: 25 | 90 days supply | Qty: 90 | Fill #0

## 2019-08-01 MED FILL — CHLORTHALIDONE 25 MG TABS: 25 | 90 days supply | Qty: 90 | Fill #0

## 2019-08-08 MED FILL — SIMVASTATIN 20 MG TABLET: 20 | 90 days supply | Qty: 90 | Fill #0

## 2019-08-08 MED FILL — GLIMEPIRIDE 2 MG TABLET: 2 | 90 days supply | Qty: 90 | Fill #0

## 2019-08-09 MED FILL — IMIQUIMOD 5% CREAM PACKET: 5 | 30 days supply | Qty: 24 | Fill #0

## 2019-08-15 MED FILL — VERAPAMIL HCL ER 240 MG TBC: 240 | 90 days supply | Qty: 90 | Fill #0

## 2019-10-24 MED FILL — CHLORTHALIDONE 25 MG TABS: 25 | 90 days supply | Qty: 90 | Fill #1

## 2019-10-24 MED FILL — LOSARTAN POTASSIUM 25 MG TA: 25 | 90 days supply | Qty: 90 | Fill #1

## 2019-10-31 MED FILL — SIMVASTATIN 20 MG TABLET: 20 | 90 days supply | Qty: 90 | Fill #1

## 2019-10-31 MED FILL — GLIMEPIRIDE 2 MG TABLET: 2 | 90 days supply | Qty: 90 | Fill #1

## 2019-11-08 ENCOUNTER — Other Ambulatory Visit (HOSPITAL_COMMUNITY): Payer: Self-pay | Admitting: Internal Medicine

## 2019-11-08 DIAGNOSIS — E109 Type 1 diabetes mellitus without complications: Secondary | ICD-10-CM | POA: Diagnosis not present

## 2019-11-08 DIAGNOSIS — E1165 Type 2 diabetes mellitus with hyperglycemia: Secondary | ICD-10-CM | POA: Diagnosis not present

## 2019-11-14 MED FILL — TADALAFIL 20 MG TABS: 20 | 90 days supply | Qty: 18 | Fill #1

## 2019-11-15 MED FILL — VARDENAFIL HCL 20 MG TABS: 20 | 30 days supply | Qty: 6 | Fill #0

## 2020-01-22 MED FILL — LOSARTAN POTASSIUM 25 MG TA: 25 | 90 days supply | Qty: 90 | Fill #2

## 2020-01-22 MED FILL — CHLORTHALIDONE 25 MG TABS: 25 | 90 days supply | Qty: 90 | Fill #2

## 2020-01-29 MED FILL — SIMVASTATIN 20 MG TABLET: 20 | 90 days supply | Qty: 90 | Fill #2

## 2020-01-30 MED FILL — GLIMEPIRIDE 2 MG TABS: 2 | 90 days supply | Qty: 90 | Fill #2

## 2020-02-08 MED FILL — VERAPAMIL HCL ER 240 MG TBC: 240 | 90 days supply | Qty: 90 | Fill #2

## 2020-03-14 DIAGNOSIS — E1165 Type 2 diabetes mellitus with hyperglycemia: Secondary | ICD-10-CM | POA: Diagnosis not present

## 2020-03-14 DIAGNOSIS — E109 Type 1 diabetes mellitus without complications: Secondary | ICD-10-CM | POA: Diagnosis not present

## 2020-03-19 MED FILL — DOXAZOSIN MESYLATE TAB 1 MG: 1 | 90 days supply | Qty: 90 | Fill #0

## 2020-04-21 MED FILL — LOSARTAN POTASSIUM 25 MG TA: 25 | 90 days supply | Qty: 90 | Fill #3

## 2020-04-21 MED FILL — CHLORTHALIDONE 25 MG TABS: 25 | 90 days supply | Qty: 90 | Fill #3

## 2020-04-28 MED FILL — GLIMEPIRIDE 2 MG TABS: 2 | 90 days supply | Qty: 90 | Fill #3

## 2020-04-28 MED FILL — SIMVASTATIN 20 MG TABLET: 20 | 90 days supply | Qty: 90 | Fill #3

## 2020-05-05 MED FILL — VERAPAMIL HCL ER 240 MG TBC: 240 | 90 days supply | Qty: 90 | Fill #3

## 2020-05-28 MED FILL — VARDENAFIL HCL 20 MG TABS: 20 | 30 days supply | Qty: 6 | Fill #1

## 2020-06-11 MED FILL — DOXAZOSIN MESYLATE TAB 1 MG: 1 | 90 days supply | Qty: 90 | Fill #1

## 2020-06-26 DIAGNOSIS — H2513 Age-related nuclear cataract, bilateral: Secondary | ICD-10-CM | POA: Diagnosis not present

## 2020-06-26 DIAGNOSIS — H40013 Open angle with borderline findings, low risk, bilateral: Secondary | ICD-10-CM | POA: Diagnosis not present

## 2020-06-26 DIAGNOSIS — E119 Type 2 diabetes mellitus without complications: Secondary | ICD-10-CM | POA: Diagnosis not present

## 2020-06-26 DIAGNOSIS — H43813 Vitreous degeneration, bilateral: Secondary | ICD-10-CM | POA: Diagnosis not present

## 2020-07-19 DIAGNOSIS — L821 Other seborrheic keratosis: Secondary | ICD-10-CM | POA: Diagnosis not present

## 2020-07-19 DIAGNOSIS — D235 Other benign neoplasm of skin of trunk: Secondary | ICD-10-CM | POA: Diagnosis not present

## 2020-07-19 DIAGNOSIS — D1801 Hemangioma of skin and subcutaneous tissue: Secondary | ICD-10-CM | POA: Diagnosis not present

## 2020-07-19 DIAGNOSIS — D2272 Melanocytic nevi of left lower limb, including hip: Secondary | ICD-10-CM | POA: Diagnosis not present

## 2020-07-19 DIAGNOSIS — D2271 Melanocytic nevi of right lower limb, including hip: Secondary | ICD-10-CM | POA: Diagnosis not present

## 2020-07-19 DIAGNOSIS — Z85828 Personal history of other malignant neoplasm of skin: Secondary | ICD-10-CM | POA: Diagnosis not present

## 2020-07-19 DIAGNOSIS — D225 Melanocytic nevi of trunk: Secondary | ICD-10-CM | POA: Diagnosis not present

## 2020-07-19 DIAGNOSIS — D171 Benign lipomatous neoplasm of skin and subcutaneous tissue of trunk: Secondary | ICD-10-CM | POA: Diagnosis not present

## 2020-07-19 DIAGNOSIS — L570 Actinic keratosis: Secondary | ICD-10-CM | POA: Diagnosis not present

## 2020-08-06 DIAGNOSIS — I10 Essential (primary) hypertension: Secondary | ICD-10-CM | POA: Diagnosis not present

## 2020-08-06 DIAGNOSIS — R42 Dizziness and giddiness: Secondary | ICD-10-CM | POA: Diagnosis not present

## 2020-08-06 DIAGNOSIS — Z131 Encounter for screening for diabetes mellitus: Secondary | ICD-10-CM | POA: Diagnosis not present

## 2020-08-06 DIAGNOSIS — Z0000 Encounter for general adult medical examination without abnormal findings: Secondary | ICD-10-CM | POA: Diagnosis not present

## 2020-08-06 DIAGNOSIS — Z1331 Encounter for screening for depression: Secondary | ICD-10-CM | POA: Diagnosis not present

## 2020-08-06 DIAGNOSIS — I70219 Atherosclerosis of native arteries of extremities with intermittent claudication, unspecified extremity: Secondary | ICD-10-CM | POA: Diagnosis not present

## 2020-08-06 DIAGNOSIS — Z1211 Encounter for screening for malignant neoplasm of colon: Secondary | ICD-10-CM | POA: Diagnosis not present

## 2020-08-06 DIAGNOSIS — Z1389 Encounter for screening for other disorder: Secondary | ICD-10-CM | POA: Diagnosis not present

## 2020-08-06 DIAGNOSIS — Z125 Encounter for screening for malignant neoplasm of prostate: Secondary | ICD-10-CM | POA: Diagnosis not present

## 2020-08-06 DIAGNOSIS — R1032 Left lower quadrant pain: Secondary | ICD-10-CM | POA: Diagnosis not present

## 2020-08-06 DIAGNOSIS — R002 Palpitations: Secondary | ICD-10-CM | POA: Diagnosis not present

## 2020-08-06 DIAGNOSIS — E7800 Pure hypercholesterolemia, unspecified: Secondary | ICD-10-CM | POA: Diagnosis not present

## 2020-08-06 DIAGNOSIS — E785 Hyperlipidemia, unspecified: Secondary | ICD-10-CM | POA: Diagnosis not present

## 2020-08-06 DIAGNOSIS — R5383 Other fatigue: Secondary | ICD-10-CM | POA: Diagnosis not present

## 2020-08-06 DIAGNOSIS — H9113 Presbycusis, bilateral: Secondary | ICD-10-CM | POA: Diagnosis not present

## 2020-08-06 DIAGNOSIS — E039 Hypothyroidism, unspecified: Secondary | ICD-10-CM | POA: Diagnosis not present

## 2020-08-24 ENCOUNTER — Other Ambulatory Visit (HOSPITAL_BASED_OUTPATIENT_CLINIC_OR_DEPARTMENT_OTHER): Payer: Self-pay

## 2020-09-03 ENCOUNTER — Other Ambulatory Visit (HOSPITAL_COMMUNITY): Payer: Self-pay

## 2020-09-03 MED ORDER — TADALAFIL 20 MG PO TABS
ORAL_TABLET | ORAL | 99 refills | Status: DC
  Filled 2020-10-03 – 2021-06-10 (×2): qty 18, 90d supply, fill #0

## 2020-09-03 MED ORDER — SIMVASTATIN 20 MG PO TABS
20.0000 mg | ORAL_TABLET | Freq: Every day | ORAL | 4 refills | Status: DC
  Filled 2020-09-03: qty 90, 90d supply, fill #0
  Filled 2020-12-14: qty 90, 90d supply, fill #1

## 2020-09-03 MED ORDER — DOXAZOSIN MESYLATE 1 MG PO TABS
1.0000 mg | ORAL_TABLET | ORAL | 4 refills | Status: DC
  Filled 2020-09-03 – 2020-10-03 (×3): qty 90, 90d supply, fill #0

## 2020-09-03 MED ORDER — LOSARTAN POTASSIUM 25 MG PO TABS
25.0000 mg | ORAL_TABLET | Freq: Every day | ORAL | 4 refills | Status: DC
Start: 2020-08-06 — End: 2023-04-15
  Filled 2020-09-03 – 2020-09-24 (×2): qty 90, 90d supply, fill #0
  Filled 2020-12-24: qty 90, 90d supply, fill #1

## 2020-09-03 MED ORDER — VERAPAMIL HCL ER 240 MG PO TBCR
240.0000 mg | EXTENDED_RELEASE_TABLET | Freq: Every day | ORAL | 4 refills | Status: DC
  Filled 2020-09-03: qty 90, 90d supply, fill #0
  Filled 2020-11-19: qty 90, 90d supply, fill #1

## 2020-09-03 MED ORDER — VARDENAFIL HCL 20 MG PO TABS: ORAL_TABLET | ORAL | 99 refills | Status: DC

## 2020-09-04 ENCOUNTER — Other Ambulatory Visit (HOSPITAL_COMMUNITY): Payer: Self-pay

## 2020-09-06 ENCOUNTER — Other Ambulatory Visit (HOSPITAL_COMMUNITY): Payer: Self-pay

## 2020-09-06 MED ORDER — GLUCOSE BLOOD VI STRP: ORAL_STRIP | 4 refills | Status: AC

## 2020-09-24 ENCOUNTER — Other Ambulatory Visit (HOSPITAL_COMMUNITY): Payer: Self-pay

## 2020-10-02 ENCOUNTER — Other Ambulatory Visit (HOSPITAL_BASED_OUTPATIENT_CLINIC_OR_DEPARTMENT_OTHER): Payer: Self-pay

## 2020-10-02 ENCOUNTER — Other Ambulatory Visit: Payer: Self-pay

## 2020-10-02 ENCOUNTER — Ambulatory Visit: Payer: 59 | Attending: Internal Medicine

## 2020-10-02 DIAGNOSIS — Z23 Encounter for immunization: Secondary | ICD-10-CM

## 2020-10-02 MED ORDER — PFIZER-BIONT COVID-19 VAC-TRIS 30 MCG/0.3ML IM SUSP
INTRAMUSCULAR | 0 refills | Status: DC
  Filled 2020-10-02: qty 0.3, 1d supply, fill #0

## 2020-10-03 ENCOUNTER — Other Ambulatory Visit (HOSPITAL_BASED_OUTPATIENT_CLINIC_OR_DEPARTMENT_OTHER): Payer: Self-pay

## 2020-10-03 ENCOUNTER — Other Ambulatory Visit (HOSPITAL_COMMUNITY): Payer: Self-pay

## 2020-10-03 MED ORDER — CHLORTHALIDONE 25 MG PO TABS
ORAL_TABLET | ORAL | 4 refills | Status: DC
  Filled 2020-10-03: qty 90, 90d supply, fill #0

## 2020-10-03 NOTE — Progress Notes (Signed)
   Covid-19 Vaccination Clinic  Name:  Jason Garrett    MRN: [id number] DOB: November 12, 1945  10/03/2020  Jason Garrett was observed post Covid-19 immunization for 15 minutes without incident. He was provided with Vaccine Information Sheet and instruction to access the V-Safe system.   Jason Garrett was instructed to call 911 with any severe reactions post vaccine: Marland Kitchen Difficulty breathing  . Swelling of face and throat  . A fast heartbeat  . A bad rash all over body  . Dizziness and weakness   Immunizations Administered    Name Date Dose VIS Date Route   PFIZER Comrnaty([place] TOP) Covid-19 Vaccine 10/02/2020  3:24 PM 0.3 mL 05/10/2020 Intramuscular   Manufacturer: [place]   Lot: [id number]   NDC: [id number]

## 2020-10-04 ENCOUNTER — Other Ambulatory Visit (HOSPITAL_COMMUNITY): Payer: Self-pay

## 2020-11-19 ENCOUNTER — Other Ambulatory Visit (HOSPITAL_COMMUNITY): Payer: Self-pay

## 2020-12-11 DIAGNOSIS — E1165 Type 2 diabetes mellitus with hyperglycemia: Secondary | ICD-10-CM | POA: Diagnosis not present

## 2020-12-11 DIAGNOSIS — I10 Essential (primary) hypertension: Secondary | ICD-10-CM | POA: Diagnosis not present

## 2020-12-15 ENCOUNTER — Other Ambulatory Visit (HOSPITAL_COMMUNITY): Payer: Self-pay

## 2020-12-24 ENCOUNTER — Other Ambulatory Visit (HOSPITAL_COMMUNITY): Payer: Self-pay

## 2021-01-14 ENCOUNTER — Other Ambulatory Visit (HOSPITAL_COMMUNITY): Payer: Self-pay

## 2021-01-15 ENCOUNTER — Other Ambulatory Visit (HOSPITAL_COMMUNITY): Payer: Self-pay

## 2021-01-21 ENCOUNTER — Other Ambulatory Visit (HOSPITAL_COMMUNITY): Payer: Self-pay

## 2021-01-21 MED ORDER — GLUCOSE BLOOD VI STRP: ORAL_STRIP | 5 refills | Status: AC

## 2021-01-21 MED ORDER — LOSARTAN POTASSIUM 25 MG PO TABS
25.0000 mg | ORAL_TABLET | Freq: Every day | ORAL | 0 refills | Status: DC
  Filled 2021-04-08: qty 90, 90d supply, fill #0
  Filled 2021-07-08: qty 90, 90d supply, fill #1

## 2021-01-21 MED ORDER — SIMVASTATIN 20 MG PO TABS
20.0000 mg | ORAL_TABLET | Freq: Every day | ORAL | 0 refills | Status: DC
  Filled 2021-03-25: qty 90, 90d supply, fill #0
  Filled 2021-06-24: qty 90, 90d supply, fill #1

## 2021-01-21 MED ORDER — DOXAZOSIN MESYLATE 1 MG PO TABS
1.0000 mg | ORAL_TABLET | Freq: Every day | ORAL | 0 refills | Status: DC
  Filled 2021-01-21: qty 90, 90d supply, fill #0
  Filled 2021-04-14: qty 90, 90d supply, fill #1
  Filled 2021-07-15: qty 90, 90d supply, fill #2

## 2021-01-21 MED ORDER — CHLORTHALIDONE 25 MG PO TABS
25.0000 mg | ORAL_TABLET | Freq: Every day | ORAL | 0 refills | Status: DC
  Filled 2021-01-31: qty 90, 90d supply, fill #0
  Filled 2021-04-29: qty 90, 90d supply, fill #1
  Filled 2021-07-15: qty 90, 90d supply, fill #2

## 2021-01-21 MED ORDER — VERAPAMIL HCL ER 240 MG PO TBCR
240.0000 mg | EXTENDED_RELEASE_TABLET | Freq: Every day | ORAL | 0 refills | Status: DC
  Filled 2021-01-21: qty 264, 264d supply, fill #0
  Filled 2021-03-04: qty 90, 90d supply, fill #0
  Filled 2021-06-10: qty 90, 90d supply, fill #1

## 2021-01-21 MED ORDER — VARDENAFIL HCL 20 MG PO TABS: 20.0000 mg | ORAL_TABLET | Freq: Every day | ORAL | 99 refills | Status: DC

## 2021-01-22 ENCOUNTER — Other Ambulatory Visit (HOSPITAL_COMMUNITY): Payer: Self-pay

## 2021-01-31 ENCOUNTER — Other Ambulatory Visit (HOSPITAL_COMMUNITY): Payer: Self-pay

## 2021-02-05 ENCOUNTER — Other Ambulatory Visit (HOSPITAL_COMMUNITY): Payer: Self-pay

## 2021-03-04 ENCOUNTER — Other Ambulatory Visit (HOSPITAL_COMMUNITY): Payer: Self-pay

## 2021-03-05 ENCOUNTER — Other Ambulatory Visit (HOSPITAL_COMMUNITY): Payer: Self-pay

## 2021-03-25 ENCOUNTER — Other Ambulatory Visit (HOSPITAL_COMMUNITY): Payer: Self-pay

## 2021-03-26 ENCOUNTER — Other Ambulatory Visit (HOSPITAL_BASED_OUTPATIENT_CLINIC_OR_DEPARTMENT_OTHER): Payer: Self-pay

## 2021-03-26 ENCOUNTER — Ambulatory Visit: Payer: 59 | Attending: Internal Medicine

## 2021-03-26 DIAGNOSIS — Z23 Encounter for immunization: Secondary | ICD-10-CM

## 2021-03-26 MED ORDER — PFIZER COVID-19 VAC BIVALENT 30 MCG/0.3ML IM SUSP
INTRAMUSCULAR | 0 refills | Status: DC
  Filled 2021-03-26: qty 0.3, 1d supply, fill #0

## 2021-03-26 NOTE — Progress Notes (Signed)
   Covid-19 Vaccination Clinic  Name:  Jason Garrett    MRN: [id number] DOB: [birth year]  03/26/2021  Jason Garrett was observed post Covid-19 immunization for 15 minutes without incident. He was provided with Vaccine Information Sheet and instruction to access the V-Safe system.   Jason Garrett was instructed to call 911 with any severe reactions post vaccine: Difficulty breathing  Swelling of face and throat  A fast heartbeat  A bad rash all over body  Dizziness and weakness   Immunizations Administered     Name Date Dose VIS Date Route   Pfizer Covid-19 Vaccine Bivalent Booster 03/26/2021  1:46 PM 0.3 mL 01/30/2021 Intramuscular   Manufacturer: [place]   Lot: [id number]   Jason Garrett: [phone]

## 2021-03-27 ENCOUNTER — Other Ambulatory Visit (HOSPITAL_COMMUNITY): Payer: Self-pay

## 2021-04-08 ENCOUNTER — Other Ambulatory Visit (HOSPITAL_COMMUNITY): Payer: Self-pay

## 2021-04-09 DIAGNOSIS — E1165 Type 2 diabetes mellitus with hyperglycemia: Secondary | ICD-10-CM | POA: Diagnosis not present

## 2021-04-14 ENCOUNTER — Other Ambulatory Visit (HOSPITAL_COMMUNITY): Payer: Self-pay

## 2021-04-15 ENCOUNTER — Other Ambulatory Visit (HOSPITAL_COMMUNITY): Payer: Self-pay

## 2021-04-29 ENCOUNTER — Other Ambulatory Visit (HOSPITAL_COMMUNITY): Payer: Self-pay

## 2021-06-10 ENCOUNTER — Other Ambulatory Visit (HOSPITAL_COMMUNITY): Payer: Self-pay

## 2021-06-24 ENCOUNTER — Other Ambulatory Visit (HOSPITAL_COMMUNITY): Payer: Self-pay

## 2021-07-08 ENCOUNTER — Other Ambulatory Visit (HOSPITAL_COMMUNITY): Payer: Self-pay

## 2021-07-15 ENCOUNTER — Other Ambulatory Visit (HOSPITAL_COMMUNITY): Payer: Self-pay

## 2021-07-30 DIAGNOSIS — Z131 Encounter for screening for diabetes mellitus: Secondary | ICD-10-CM | POA: Diagnosis not present

## 2021-07-30 DIAGNOSIS — Z7289 Other problems related to lifestyle: Secondary | ICD-10-CM | POA: Diagnosis not present

## 2021-07-30 DIAGNOSIS — E039 Hypothyroidism, unspecified: Secondary | ICD-10-CM | POA: Diagnosis not present

## 2021-07-30 DIAGNOSIS — Z79899 Other long term (current) drug therapy: Secondary | ICD-10-CM | POA: Diagnosis not present

## 2021-07-30 DIAGNOSIS — E7800 Pure hypercholesterolemia, unspecified: Secondary | ICD-10-CM | POA: Diagnosis not present

## 2021-07-30 DIAGNOSIS — R5383 Other fatigue: Secondary | ICD-10-CM | POA: Diagnosis not present

## 2021-07-30 DIAGNOSIS — Z125 Encounter for screening for malignant neoplasm of prostate: Secondary | ICD-10-CM | POA: Diagnosis not present

## 2021-08-07 DIAGNOSIS — R002 Palpitations: Secondary | ICD-10-CM | POA: Diagnosis not present

## 2021-08-07 DIAGNOSIS — D485 Neoplasm of uncertain behavior of skin: Secondary | ICD-10-CM | POA: Diagnosis not present

## 2021-08-07 DIAGNOSIS — Z0000 Encounter for general adult medical examination without abnormal findings: Secondary | ICD-10-CM | POA: Diagnosis not present

## 2021-08-07 DIAGNOSIS — Z1211 Encounter for screening for malignant neoplasm of colon: Secondary | ICD-10-CM | POA: Diagnosis not present

## 2021-08-07 DIAGNOSIS — R42 Dizziness and giddiness: Secondary | ICD-10-CM | POA: Diagnosis not present

## 2021-08-07 DIAGNOSIS — R1032 Left lower quadrant pain: Secondary | ICD-10-CM | POA: Diagnosis not present

## 2021-08-07 DIAGNOSIS — E1165 Type 2 diabetes mellitus with hyperglycemia: Secondary | ICD-10-CM | POA: Diagnosis not present

## 2021-08-07 DIAGNOSIS — I70219 Atherosclerosis of native arteries of extremities with intermittent claudication, unspecified extremity: Secondary | ICD-10-CM | POA: Diagnosis not present

## 2021-08-07 DIAGNOSIS — Z1331 Encounter for screening for depression: Secondary | ICD-10-CM | POA: Diagnosis not present

## 2021-08-07 DIAGNOSIS — E785 Hyperlipidemia, unspecified: Secondary | ICD-10-CM | POA: Diagnosis not present

## 2021-08-07 DIAGNOSIS — H9113 Presbycusis, bilateral: Secondary | ICD-10-CM | POA: Diagnosis not present

## 2021-08-07 DIAGNOSIS — Z1389 Encounter for screening for other disorder: Secondary | ICD-10-CM | POA: Diagnosis not present

## 2021-08-23 ENCOUNTER — Other Ambulatory Visit (HOSPITAL_COMMUNITY): Payer: Self-pay

## 2021-08-23 DIAGNOSIS — H40013 Open angle with borderline findings, low risk, bilateral: Secondary | ICD-10-CM | POA: Diagnosis not present

## 2021-08-23 DIAGNOSIS — H2513 Age-related nuclear cataract, bilateral: Secondary | ICD-10-CM | POA: Diagnosis not present

## 2021-08-23 DIAGNOSIS — E119 Type 2 diabetes mellitus without complications: Secondary | ICD-10-CM | POA: Diagnosis not present

## 2021-08-23 DIAGNOSIS — H43813 Vitreous degeneration, bilateral: Secondary | ICD-10-CM | POA: Diagnosis not present

## 2021-08-23 MED ORDER — GLIMEPIRIDE 2 MG PO TABS
ORAL_TABLET | ORAL | 4 refills | Status: DC
  Filled 2021-08-23: qty 90, 90d supply, fill #0
  Filled 2022-06-23: qty 90, 90d supply, fill #1

## 2021-09-10 ENCOUNTER — Other Ambulatory Visit (HOSPITAL_COMMUNITY): Payer: Self-pay

## 2021-09-10 MED ORDER — VERAPAMIL HCL ER 240 MG PO TBCR
EXTENDED_RELEASE_TABLET | ORAL | 4 refills | Status: DC
  Filled 2021-09-10: qty 90, 90d supply, fill #0
  Filled 2021-12-08: qty 90, 90d supply, fill #1

## 2021-09-10 MED ORDER — SIMVASTATIN 20 MG PO TABS
ORAL_TABLET | ORAL | 4 refills | Status: DC
  Filled 2021-09-10: qty 90, 90d supply, fill #0
  Filled 2021-12-23: qty 90, 90d supply, fill #1
  Filled 2022-03-24: qty 90, 90d supply, fill #2
  Filled 2022-06-30: qty 90, 90d supply, fill #3

## 2021-10-02 DIAGNOSIS — D171 Benign lipomatous neoplasm of skin and subcutaneous tissue of trunk: Secondary | ICD-10-CM | POA: Diagnosis not present

## 2021-10-02 DIAGNOSIS — L570 Actinic keratosis: Secondary | ICD-10-CM | POA: Diagnosis not present

## 2021-10-02 DIAGNOSIS — D225 Melanocytic nevi of trunk: Secondary | ICD-10-CM | POA: Diagnosis not present

## 2021-10-02 DIAGNOSIS — Z85828 Personal history of other malignant neoplasm of skin: Secondary | ICD-10-CM | POA: Diagnosis not present

## 2021-10-02 DIAGNOSIS — D2272 Melanocytic nevi of left lower limb, including hip: Secondary | ICD-10-CM | POA: Diagnosis not present

## 2021-10-02 DIAGNOSIS — D235 Other benign neoplasm of skin of trunk: Secondary | ICD-10-CM | POA: Diagnosis not present

## 2021-10-02 DIAGNOSIS — D485 Neoplasm of uncertain behavior of skin: Secondary | ICD-10-CM | POA: Diagnosis not present

## 2021-10-07 ENCOUNTER — Other Ambulatory Visit (HOSPITAL_COMMUNITY): Payer: Self-pay

## 2021-10-07 MED ORDER — TADALAFIL 20 MG PO TABS
20.0000 mg | ORAL_TABLET | Freq: Every day | ORAL | 5 refills | Status: DC
  Filled 2021-10-07: qty 18, 30d supply, fill #0
  Filled 2022-01-07: qty 18, 30d supply, fill #1
  Filled 2022-04-22: qty 18, 30d supply, fill #2
  Filled 2022-07-28: qty 18, 30d supply, fill #3

## 2021-10-07 MED ORDER — LOSARTAN POTASSIUM 25 MG PO TABS
25.0000 mg | ORAL_TABLET | Freq: Every day | ORAL | 4 refills | Status: DC
  Filled 2021-10-07: qty 90, 90d supply, fill #0
  Filled 2022-01-07: qty 90, 90d supply, fill #1
  Filled 2022-04-10: qty 90, 90d supply, fill #2
  Filled 2022-07-14 (×3): qty 90, 90d supply, fill #3

## 2021-10-07 MED ORDER — CHLORTHALIDONE 25 MG PO TABS
25.0000 mg | ORAL_TABLET | Freq: Every day | ORAL | 4 refills | Status: DC
  Filled 2021-10-07: qty 90, 90d supply, fill #0
  Filled 2022-02-04: qty 90, 90d supply, fill #1
  Filled 2022-05-05: qty 90, 90d supply, fill #2
  Filled 2022-07-28: qty 90, 90d supply, fill #3

## 2021-10-07 MED ORDER — DOXAZOSIN MESYLATE 1 MG PO TABS
1.0000 mg | ORAL_TABLET | Freq: Every day | ORAL | 4 refills | Status: DC
  Filled 2021-10-07: qty 90, 90d supply, fill #0
  Filled 2022-01-19: qty 90, 90d supply, fill #1
  Filled 2022-04-22: qty 90, 90d supply, fill #2
  Filled 2022-07-19: qty 90, 90d supply, fill #3

## 2021-10-08 ENCOUNTER — Other Ambulatory Visit (HOSPITAL_COMMUNITY): Payer: Self-pay

## 2021-12-09 ENCOUNTER — Other Ambulatory Visit (HOSPITAL_COMMUNITY): Payer: Self-pay

## 2021-12-10 DIAGNOSIS — E1165 Type 2 diabetes mellitus with hyperglycemia: Secondary | ICD-10-CM | POA: Diagnosis not present

## 2021-12-23 ENCOUNTER — Other Ambulatory Visit (HOSPITAL_COMMUNITY): Payer: Self-pay

## 2022-01-07 ENCOUNTER — Other Ambulatory Visit (HOSPITAL_COMMUNITY): Payer: Self-pay

## 2022-01-08 ENCOUNTER — Other Ambulatory Visit (HOSPITAL_COMMUNITY): Payer: Self-pay

## 2022-01-20 ENCOUNTER — Other Ambulatory Visit (HOSPITAL_COMMUNITY): Payer: Self-pay

## 2022-02-04 ENCOUNTER — Other Ambulatory Visit (HOSPITAL_COMMUNITY): Payer: Self-pay

## 2022-03-24 ENCOUNTER — Other Ambulatory Visit (HOSPITAL_COMMUNITY): Payer: Self-pay

## 2022-04-01 DIAGNOSIS — E1165 Type 2 diabetes mellitus with hyperglycemia: Secondary | ICD-10-CM | POA: Diagnosis not present

## 2022-04-01 DIAGNOSIS — D485 Neoplasm of uncertain behavior of skin: Secondary | ICD-10-CM | POA: Diagnosis not present

## 2022-04-04 ENCOUNTER — Other Ambulatory Visit (HOSPITAL_BASED_OUTPATIENT_CLINIC_OR_DEPARTMENT_OTHER): Payer: Self-pay

## 2022-04-04 MED ORDER — COMIRNATY 30 MCG/0.3ML IM SUSY
PREFILLED_SYRINGE | INTRAMUSCULAR | 0 refills | Status: DC
  Filled 2022-04-04: qty 0.3, 1d supply, fill #0

## 2022-04-10 ENCOUNTER — Other Ambulatory Visit (HOSPITAL_COMMUNITY): Payer: Self-pay

## 2022-04-11 ENCOUNTER — Other Ambulatory Visit (HOSPITAL_COMMUNITY): Payer: Self-pay

## 2022-04-22 ENCOUNTER — Other Ambulatory Visit (HOSPITAL_COMMUNITY): Payer: Self-pay

## 2022-05-05 ENCOUNTER — Other Ambulatory Visit (HOSPITAL_COMMUNITY): Payer: Self-pay

## 2022-06-12 ENCOUNTER — Other Ambulatory Visit (HOSPITAL_BASED_OUTPATIENT_CLINIC_OR_DEPARTMENT_OTHER): Payer: Self-pay

## 2022-06-12 MED ORDER — AREXVY 120 MCG/0.5ML IM SUSR
INTRAMUSCULAR | 0 refills | Status: DC
  Filled 2022-06-12: qty 0.5, 1d supply, fill #0

## 2022-06-23 ENCOUNTER — Other Ambulatory Visit (HOSPITAL_COMMUNITY): Payer: Self-pay

## 2022-06-30 ENCOUNTER — Other Ambulatory Visit (HOSPITAL_COMMUNITY): Payer: Self-pay

## 2022-07-14 ENCOUNTER — Other Ambulatory Visit: Payer: Self-pay

## 2022-07-14 ENCOUNTER — Other Ambulatory Visit (HOSPITAL_COMMUNITY): Payer: Self-pay

## 2022-07-16 DIAGNOSIS — H2513 Age-related nuclear cataract, bilateral: Secondary | ICD-10-CM | POA: Diagnosis not present

## 2022-07-16 DIAGNOSIS — Z7984 Long term (current) use of oral hypoglycemic drugs: Secondary | ICD-10-CM | POA: Diagnosis not present

## 2022-07-16 DIAGNOSIS — E119 Type 2 diabetes mellitus without complications: Secondary | ICD-10-CM | POA: Diagnosis not present

## 2022-07-19 ENCOUNTER — Other Ambulatory Visit (HOSPITAL_COMMUNITY): Payer: Self-pay

## 2022-07-28 ENCOUNTER — Other Ambulatory Visit (HOSPITAL_COMMUNITY): Payer: Self-pay

## 2022-08-07 ENCOUNTER — Other Ambulatory Visit (HOSPITAL_COMMUNITY): Payer: Self-pay

## 2022-08-13 DIAGNOSIS — Z1389 Encounter for screening for other disorder: Secondary | ICD-10-CM | POA: Diagnosis not present

## 2022-08-13 DIAGNOSIS — R5383 Other fatigue: Secondary | ICD-10-CM | POA: Diagnosis not present

## 2022-08-13 DIAGNOSIS — R1032 Left lower quadrant pain: Secondary | ICD-10-CM | POA: Diagnosis not present

## 2022-08-13 DIAGNOSIS — R42 Dizziness and giddiness: Secondary | ICD-10-CM | POA: Diagnosis not present

## 2022-08-13 DIAGNOSIS — I70219 Atherosclerosis of native arteries of extremities with intermittent claudication, unspecified extremity: Secondary | ICD-10-CM | POA: Diagnosis not present

## 2022-08-13 DIAGNOSIS — E785 Hyperlipidemia, unspecified: Secondary | ICD-10-CM | POA: Diagnosis not present

## 2022-08-13 DIAGNOSIS — Z1331 Encounter for screening for depression: Secondary | ICD-10-CM | POA: Diagnosis not present

## 2022-08-13 DIAGNOSIS — D649 Anemia, unspecified: Secondary | ICD-10-CM | POA: Diagnosis not present

## 2022-08-13 DIAGNOSIS — Z1211 Encounter for screening for malignant neoplasm of colon: Secondary | ICD-10-CM | POA: Diagnosis not present

## 2022-08-13 DIAGNOSIS — E039 Hypothyroidism, unspecified: Secondary | ICD-10-CM | POA: Diagnosis not present

## 2022-08-13 DIAGNOSIS — Z7289 Other problems related to lifestyle: Secondary | ICD-10-CM | POA: Diagnosis not present

## 2022-08-13 DIAGNOSIS — H9113 Presbycusis, bilateral: Secondary | ICD-10-CM | POA: Diagnosis not present

## 2022-08-13 DIAGNOSIS — Z0000 Encounter for general adult medical examination without abnormal findings: Secondary | ICD-10-CM | POA: Diagnosis not present

## 2022-08-13 DIAGNOSIS — Z131 Encounter for screening for diabetes mellitus: Secondary | ICD-10-CM | POA: Diagnosis not present

## 2022-08-13 DIAGNOSIS — R002 Palpitations: Secondary | ICD-10-CM | POA: Diagnosis not present

## 2022-08-13 DIAGNOSIS — D485 Neoplasm of uncertain behavior of skin: Secondary | ICD-10-CM | POA: Diagnosis not present

## 2022-08-13 DIAGNOSIS — M810 Age-related osteoporosis without current pathological fracture: Secondary | ICD-10-CM | POA: Diagnosis not present

## 2022-08-13 DIAGNOSIS — E7800 Pure hypercholesterolemia, unspecified: Secondary | ICD-10-CM | POA: Diagnosis not present

## 2022-08-13 DIAGNOSIS — E1165 Type 2 diabetes mellitus with hyperglycemia: Secondary | ICD-10-CM | POA: Diagnosis not present

## 2022-08-13 DIAGNOSIS — Z79899 Other long term (current) drug therapy: Secondary | ICD-10-CM | POA: Diagnosis not present

## 2022-08-13 DIAGNOSIS — Z125 Encounter for screening for malignant neoplasm of prostate: Secondary | ICD-10-CM | POA: Diagnosis not present

## 2022-10-02 ENCOUNTER — Other Ambulatory Visit (HOSPITAL_COMMUNITY): Payer: Self-pay

## 2022-10-02 ENCOUNTER — Other Ambulatory Visit: Payer: Self-pay

## 2022-10-02 MED ORDER — DOXAZOSIN MESYLATE 1 MG PO TABS
1.0000 mg | ORAL_TABLET | Freq: Every day | ORAL | 4 refills | Status: DC
  Filled 2022-10-02: qty 90, 90d supply, fill #0
  Filled 2023-01-19: qty 90, 90d supply, fill #1
  Filled 2023-04-27: qty 90, 90d supply, fill #2
  Filled 2023-07-26: qty 90, 90d supply, fill #3

## 2022-10-02 MED ORDER — SIMVASTATIN 20 MG PO TABS
20.0000 mg | ORAL_TABLET | Freq: Every day | ORAL | 4 refills | Status: DC
  Filled 2022-10-02: qty 90, 90d supply, fill #0
  Filled 2022-12-30: qty 90, 90d supply, fill #1
  Filled 2023-03-24 – 2023-03-25 (×2): qty 90, 90d supply, fill #2
  Filled 2023-06-29: qty 90, 90d supply, fill #3

## 2022-10-02 MED ORDER — LOSARTAN POTASSIUM 25 MG PO TABS
25.0000 mg | ORAL_TABLET | Freq: Every day | ORAL | 4 refills | Status: DC
  Filled 2022-10-02: qty 90, 90d supply, fill #0
  Filled 2023-01-11: qty 90, 90d supply, fill #1
  Filled 2023-04-07: qty 90, 90d supply, fill #2

## 2022-10-07 DIAGNOSIS — D171 Benign lipomatous neoplasm of skin and subcutaneous tissue of trunk: Secondary | ICD-10-CM | POA: Diagnosis not present

## 2022-10-07 DIAGNOSIS — L821 Other seborrheic keratosis: Secondary | ICD-10-CM | POA: Diagnosis not present

## 2022-10-07 DIAGNOSIS — D2271 Melanocytic nevi of right lower limb, including hip: Secondary | ICD-10-CM | POA: Diagnosis not present

## 2022-10-07 DIAGNOSIS — D225 Melanocytic nevi of trunk: Secondary | ICD-10-CM | POA: Diagnosis not present

## 2022-10-07 DIAGNOSIS — L918 Other hypertrophic disorders of the skin: Secondary | ICD-10-CM | POA: Diagnosis not present

## 2022-10-07 DIAGNOSIS — Z85828 Personal history of other malignant neoplasm of skin: Secondary | ICD-10-CM | POA: Diagnosis not present

## 2022-10-07 DIAGNOSIS — D2261 Melanocytic nevi of right upper limb, including shoulder: Secondary | ICD-10-CM | POA: Diagnosis not present

## 2022-10-07 DIAGNOSIS — D2272 Melanocytic nevi of left lower limb, including hip: Secondary | ICD-10-CM | POA: Diagnosis not present

## 2022-10-07 DIAGNOSIS — D485 Neoplasm of uncertain behavior of skin: Secondary | ICD-10-CM | POA: Diagnosis not present

## 2022-10-07 DIAGNOSIS — L570 Actinic keratosis: Secondary | ICD-10-CM | POA: Diagnosis not present

## 2022-10-08 ENCOUNTER — Other Ambulatory Visit (HOSPITAL_COMMUNITY): Payer: Self-pay

## 2022-10-08 DIAGNOSIS — J209 Acute bronchitis, unspecified: Secondary | ICD-10-CM | POA: Diagnosis not present

## 2022-10-08 DIAGNOSIS — E1100 Type 2 diabetes mellitus with hyperosmolarity without nonketotic hyperglycemic-hyperosmolar coma (NKHHC): Secondary | ICD-10-CM | POA: Diagnosis not present

## 2022-10-08 MED ORDER — CEPHALEXIN 500 MG PO CAPS
500.0000 mg | ORAL_CAPSULE | Freq: Four times a day (QID) | ORAL | 0 refills | Status: DC
  Filled 2022-10-08: qty 40, 10d supply, fill #0

## 2022-10-18 ENCOUNTER — Other Ambulatory Visit (HOSPITAL_COMMUNITY): Payer: Self-pay

## 2022-10-18 MED ORDER — TADALAFIL 20 MG PO TABS
20.0000 mg | ORAL_TABLET | Freq: Every day | ORAL | 11 refills | Status: DC
  Filled 2022-10-18: qty 18, 18d supply, fill #0
  Filled 2023-03-24 – 2023-03-25 (×2): qty 18, 18d supply, fill #1

## 2022-10-18 MED ORDER — GLIMEPIRIDE 2 MG PO TABS
2.0000 mg | ORAL_TABLET | Freq: Every day | ORAL | 4 refills | Status: DC
  Filled 2022-10-18: qty 90, 90d supply, fill #0

## 2022-10-18 MED ORDER — CHLORTHALIDONE 25 MG PO TABS
25.0000 mg | ORAL_TABLET | Freq: Every day | ORAL | 4 refills | Status: DC
  Filled 2022-10-18: qty 90, 90d supply, fill #0
  Filled 2023-02-03: qty 90, 90d supply, fill #1

## 2022-10-20 ENCOUNTER — Other Ambulatory Visit (HOSPITAL_COMMUNITY): Payer: Self-pay

## 2022-10-24 ENCOUNTER — Ambulatory Visit
Admission: RE | Admit: 2022-10-24 | Discharge: 2022-10-24 | Disposition: A | Discharge status: Home / Self Care | Payer: Medicare Other | Source: Ambulatory Visit | Attending: Internal Medicine | Admitting: Internal Medicine

## 2022-10-24 ENCOUNTER — Other Ambulatory Visit: Payer: Self-pay | Admitting: Internal Medicine

## 2022-10-24 DIAGNOSIS — R059 Cough, unspecified: Secondary | ICD-10-CM

## 2022-10-24 DIAGNOSIS — R052 Subacute cough: Secondary | ICD-10-CM | POA: Diagnosis not present

## 2022-11-14 ENCOUNTER — Other Ambulatory Visit (HOSPITAL_COMMUNITY): Payer: Self-pay

## 2022-12-30 ENCOUNTER — Other Ambulatory Visit (HOSPITAL_COMMUNITY): Payer: Self-pay

## 2023-01-11 ENCOUNTER — Other Ambulatory Visit: Payer: Self-pay

## 2023-01-11 ENCOUNTER — Other Ambulatory Visit (HOSPITAL_COMMUNITY): Payer: Self-pay

## 2023-01-19 ENCOUNTER — Other Ambulatory Visit: Payer: Self-pay

## 2023-02-03 ENCOUNTER — Other Ambulatory Visit (HOSPITAL_COMMUNITY): Payer: Self-pay

## 2023-02-17 ENCOUNTER — Other Ambulatory Visit (HOSPITAL_BASED_OUTPATIENT_CLINIC_OR_DEPARTMENT_OTHER): Payer: Self-pay

## 2023-02-17 MED ORDER — COMIRNATY 30 MCG/0.3ML IM SUSY
PREFILLED_SYRINGE | Freq: Once | INTRAMUSCULAR | 0 refills | Status: AC
  Filled 2023-02-17: qty 0.3, 1d supply, fill #0

## 2023-03-24 ENCOUNTER — Other Ambulatory Visit (HOSPITAL_COMMUNITY): Payer: Self-pay

## 2023-03-25 ENCOUNTER — Other Ambulatory Visit (HOSPITAL_COMMUNITY): Payer: Self-pay

## 2023-04-07 ENCOUNTER — Other Ambulatory Visit (HOSPITAL_COMMUNITY): Payer: Self-pay

## 2023-04-13 ENCOUNTER — Observation Stay (HOSPITAL_COMMUNITY)
Admission: EM | Admit: 2023-04-13 | Discharge: 2023-04-15 | Disposition: A | Discharge status: Home / Self Care | Payer: Medicare Other | Attending: Internal Medicine | Admitting: Internal Medicine

## 2023-04-13 ENCOUNTER — Other Ambulatory Visit: Payer: Self-pay

## 2023-04-13 ENCOUNTER — Encounter (HOSPITAL_COMMUNITY): Payer: Self-pay

## 2023-04-13 ENCOUNTER — Emergency Department (HOSPITAL_COMMUNITY): Payer: Medicare Other

## 2023-04-13 DIAGNOSIS — N2889 Other specified disorders of kidney and ureter: Secondary | ICD-10-CM | POA: Insufficient documentation

## 2023-04-13 DIAGNOSIS — E162 Hypoglycemia, unspecified: Principal | ICD-10-CM

## 2023-04-13 DIAGNOSIS — Z7982 Long term (current) use of aspirin: Secondary | ICD-10-CM | POA: Insufficient documentation

## 2023-04-13 DIAGNOSIS — R42 Dizziness and giddiness: Secondary | ICD-10-CM | POA: Diagnosis present

## 2023-04-13 DIAGNOSIS — E11649 Type 2 diabetes mellitus with hypoglycemia without coma: Principal | ICD-10-CM | POA: Diagnosis present

## 2023-04-13 DIAGNOSIS — E1169 Type 2 diabetes mellitus with other specified complication: Secondary | ICD-10-CM | POA: Diagnosis not present

## 2023-04-13 DIAGNOSIS — Z7984 Long term (current) use of oral hypoglycemic drugs: Secondary | ICD-10-CM | POA: Insufficient documentation

## 2023-04-13 DIAGNOSIS — E876 Hypokalemia: Secondary | ICD-10-CM | POA: Diagnosis present

## 2023-04-13 DIAGNOSIS — Z79899 Other long term (current) drug therapy: Secondary | ICD-10-CM | POA: Diagnosis not present

## 2023-04-13 DIAGNOSIS — E785 Hyperlipidemia, unspecified: Secondary | ICD-10-CM | POA: Diagnosis not present

## 2023-04-13 DIAGNOSIS — I152 Hypertension secondary to endocrine disorders: Secondary | ICD-10-CM | POA: Diagnosis present

## 2023-04-13 DIAGNOSIS — N289 Disorder of kidney and ureter, unspecified: Secondary | ICD-10-CM

## 2023-04-13 DIAGNOSIS — E1159 Type 2 diabetes mellitus with other circulatory complications: Secondary | ICD-10-CM | POA: Diagnosis present

## 2023-04-13 LAB — CBG MONITORING, ED
Glucose-Capillary: 121 mg/dL — ABNORMAL HIGH (ref 70–99)
Glucose-Capillary: 43 mg/dL — CL (ref 70–99)
Glucose-Capillary: 96 mg/dL (ref 70–99)
Glucose-Capillary: 90 mg/dL (ref 70–99)
Glucose-Capillary: 59 mg/dL — ABNORMAL LOW (ref 70–99)
Glucose-Capillary: 74 mg/dL (ref 70–99)
Glucose-Capillary: 136 mg/dL — ABNORMAL HIGH (ref 70–99)

## 2023-04-13 LAB — BASIC METABOLIC PANEL
Anion gap: 10 (ref 5–15)
BUN: 38 mg/dL — ABNORMAL HIGH (ref 8–23)
CO2: 23 mmol/L (ref 22–32)
Calcium: 9.9 mg/dL (ref 8.9–10.3)
Chloride: 103 mmol/L (ref 98–111)
Creatinine, Ser: 2.27 mg/dL — ABNORMAL HIGH (ref 0.61–1.24)
GFR, Estimated: 29 mL/min — ABNORMAL LOW (ref >60)
Glucose, Bld: 131 mg/dL — ABNORMAL HIGH (ref 70–99)
Potassium: 3.4 mmol/L — ABNORMAL LOW (ref 3.5–5.1)
Sodium: 136 mmol/L (ref 135–145)

## 2023-04-13 LAB — CBC WITH DIFFERENTIAL/PLATELET
Abs Immature Granulocytes: 0.03 10*3/uL (ref 0.00–0.07)
Basophils Absolute: 0 10*3/uL (ref 0.0–0.1)
Basophils Relative: 0 %
Eosinophils Absolute: 0.1 10*3/uL (ref 0.0–0.5)
Eosinophils Relative: 1 %
HCT: 42 % (ref 39.0–52.0)
Hemoglobin: 14.2 g/dL (ref 13.0–17.0)
Immature Granulocytes: 0 %
Lymphocytes Relative: 11 %
Lymphs Abs: 1.1 10*3/uL (ref 0.7–4.0)
MCH: 31.5 pg (ref 26.0–34.0)
MCHC: 33.8 g/dL (ref 30.0–36.0)
MCV: 93.1 fL (ref 80.0–100.0)
Monocytes Absolute: 0.5 10*3/uL (ref 0.1–1.0)
Monocytes Relative: 5 %
Neutro Abs: 8.2 10*3/uL — ABNORMAL HIGH (ref 1.7–7.7)
Neutrophils Relative %: 83 %
Platelets: 196 10*3/uL (ref 150–400)
RBC: 4.51 MIL/uL (ref 4.22–5.81)
RDW: 12.9 % (ref 11.5–15.5)
WBC: 9.8 10*3/uL (ref 4.0–10.5)
nRBC: 0 % (ref 0.0–0.2)

## 2023-04-13 LAB — ETHANOL: Alcohol, Ethyl (B): <10 mg/dL (ref <10)

## 2023-04-13 MED ORDER — ACETAMINOPHEN 650 MG RE SUPP: 650.0000 mg | Freq: Four times a day (QID) | RECTAL | Status: DC | PRN

## 2023-04-13 MED ORDER — SENNOSIDES-DOCUSATE SODIUM 8.6-50 MG PO TABS: 1.0000 | ORAL_TABLET | Freq: Every evening | ORAL | Status: DC | PRN

## 2023-04-13 MED ORDER — POTASSIUM CHLORIDE 20 MEQ PO PACK
40.0000 meq | PACK | Freq: Once | ORAL | Status: AC
  Administered 2023-04-14: 40 meq via ORAL
  Filled 2023-04-13: qty 2

## 2023-04-13 MED ORDER — ACETAMINOPHEN 325 MG PO TABS: 650.0000 mg | ORAL_TABLET | Freq: Four times a day (QID) | ORAL | Status: DC | PRN

## 2023-04-13 MED ORDER — ONDANSETRON HCL 4 MG PO TABS: 4.0000 mg | ORAL_TABLET | Freq: Four times a day (QID) | ORAL | Status: DC | PRN

## 2023-04-13 MED ORDER — ONDANSETRON HCL 4 MG/2ML IJ SOLN: 4.0000 mg | Freq: Four times a day (QID) | INTRAMUSCULAR | Status: DC | PRN

## 2023-04-13 MED ORDER — ENOXAPARIN SODIUM 40 MG/0.4ML IJ SOSY
40.0000 mg | PREFILLED_SYRINGE | INTRAMUSCULAR | Status: DC
Start: 2023-04-14 — End: 2023-04-15
  Administered 2023-04-14: 40 mg via SUBCUTANEOUS
  Filled 2023-04-13: qty 0.4

## 2023-04-13 MED ORDER — SODIUM CHLORIDE 0.9% FLUSH
3.0000 mL | Freq: Two times a day (BID) | INTRAVENOUS | Status: DC
  Administered 2023-04-13 – 2023-04-15 (×3): 3 mL via INTRAVENOUS

## 2023-04-13 MED ORDER — SODIUM CHLORIDE 0.9% FLUSH: 3.0000 mL | INTRAVENOUS | Status: DC | PRN

## 2023-04-13 MED ORDER — SODIUM CHLORIDE 0.9% FLUSH: 3.0000 mL | Freq: Two times a day (BID) | INTRAVENOUS | Status: DC

## 2023-04-13 MED ORDER — SODIUM CHLORIDE 0.9 % IV SOLN
250.0000 mL | INTRAVENOUS | Status: AC | PRN
  Administered 2023-04-13: 250 mL via INTRAVENOUS

## 2023-04-13 NOTE — ED Provider Notes (Signed)
[place] EMERGENCY DEPARTMENT AT [facility] Provider Note   CSN: [id number] Arrival date & time: 04/13/23  1728     History  Chief Complaint  Patient presents with   Dizziness    Jason Garrett is a 77 y.o. male with history of diabetes presenting from home with concern for confusion.  The patient's wife reports that the patient was feeling "dizzy" earlier around lunch which is often a symptom of low blood sugar.  He ate a regular lunch and then took a nap.  The lunch was around noon.  He then reported that he was "going to work in the office".  He works from home for [employer].  His wife came in at approximately 4:30 PM into the office and found the patient sitting in front of blank computer screens, staring at screens.  He seemed suddenly very confused.  He was not able to answer any specific questions to her and seemed disoriented.  She denies any slurred speech, facial droop, or obvious weakness of the extremities.  Patient has a history of diabetes for which he takes 2.5 mg of glipizide daily.  He says his A1c is generally well-controlled but when he comes off this medicine it tends to go up.  He reports he does have frequent episodes of lightheadedness when he feels her sugars getting low, but can often eat food when he has the symptoms.    HPI     Home Medications Prior to Admission medications   Medication Sig Start Date End Date Taking? Authorizing Provider  aspirin 81 MG tablet Take 81 mg by mouth daily.    [provider]  cephALEXin (KEFLEX) 500 MG capsule Take 1 capsule (500 mg total) by mouth 4 (four) times daily. 10/08/22     chlorthalidone (HYGROTON) 25 MG tablet Take 25 mg by mouth daily before breakfast.     [provider]  chlorthalidone (HYGROTON) 25 MG tablet Take 1 tablet by mouth once daily 08/06/20     chlorthalidone (HYGROTON) 25 MG tablet Take 1 tablet (25 mg total) by mouth daily. 08/13/22     COVID-19 mRNA bivalent vaccine,  Pfizer, (PFIZER COVID-19 VAC BIVALENT) injection Inject into the muscle. 03/26/21   Judyann Munson, MD  COVID-19 mRNA Vac-TriS, Pfizer, (PFIZER-BIONT COVID-19 VAC-TRIS) SUSP injection Inject into the muscle. 10/02/20   Judyann Munson, MD  COVID-19 mRNA vaccine [phone] (COMIRNATY) syringe Inject into the muscle. 04/04/22   Judyann Munson, MD  doxazosin (CARDURA) 1 MG tablet Take 1 mg by mouth 2 (two) times daily.     [provider]  doxazosin (CARDURA) 1 MG tablet Take 1 tablet (1 mg total) by mouth daily for blood pressure. 08/13/22     doxazosin (CARDURA) 1 MG tablet TAKE 1 TABLET BY MOUTH ONCE DAILY BLOOD PRESSURE 07/05/19 07/04/20  Burton Apley, MD  doxazosin (CARDURA) 1 MG tablet Take 1 tablet (1 mg total) by mouth daily 09/03/20     glimepiride (AMARYL) 2 MG tablet Take 1 mg by mouth daily before breakfast.     [provider]  glimepiride (AMARYL) 2 MG tablet Take 1 tablet by mouth daily for diabetes mellitus. 08/07/21     glimepiride (AMARYL) 2 MG tablet Take 1 tablet (2 mg total) by mouth daily. 08/13/22     glucose blood test strip Use to test blood sugar daily as directed 09/03/20     glucose blood test strip Use to test daily as directed 08/06/20   Burton Apley, MD  losartan (COZAAR) 25 MG tablet Take 25 mg by mouth at bedtime.     [provider]  losartan (COZAAR) 25 MG tablet Take 1 tablet (25 mg total) by mouth daily. 08/06/20     losartan (COZAAR) 25 MG tablet Take 1 tablet (25 mg total) by mouth daily. 08/13/22     losartan (COZAAR) 25 MG tablet Take 1 tablet (25 mg total) by mouth daily. 08/06/20   Burton Apley, MD  RSV vaccine recomb adjuvanted (AREXVY) 120 MCG/0.5ML injection Inject into the muscle. 06/12/22   Judyann Munson, MD  simvastatin (ZOCOR) 20 MG tablet Take 20 mg by mouth daily before breakfast.     [provider]  simvastatin (ZOCOR) 20 MG tablet Take 1 tablet by mouth daily 08/07/21     simvastatin (ZOCOR) 20 MG tablet Take 1 tablet (20  mg total) by mouth daily. 08/13/22     simvastatin (ZOCOR) 20 MG tablet Take 1 tablet (20 mg total) by mouth daily. 08/06/20     tadalafil (CIALIS) 20 MG tablet Take 1 tablet (20 mg total) by mouth daily as directed 08/13/22     tadalafil (CIALIS) 20 MG tablet Take 1 tablet by mouth daily 09/03/20   Burton Apley, MD  vardenafil (LEVITRA) 20 MG tablet Take 1 tablet (20 mg total) by mouth daily as directed 01/14/21     vardenafil (LEVITRA) 20 MG tablet TAKE 1 TABLET BY MOUTH DAILY AS DIRECTED 11/08/19 11/07/20  Burton Apley, MD  vardenafil (LEVITRA) 20 MG tablet Take 1 tablet by mouth daily as directed 09/03/20   Burton Apley, MD  verapamil (CALAN-SR) 240 MG CR tablet Take 1 tablet by mouth daily 08/07/21     verapamil (CALAN-SR) 240 MG CR tablet Take 1 tablet (240 mg total) by mouth daily. 08/06/20     verapamil (COVERA HS) 240 MG (CO) 24 hr tablet Take 240 mg by mouth daily before breakfast.     [provider]      Allergies    Prilocaine    Review of Systems   Review of Systems  Physical Exam Updated Vital Signs BP 121/67   Pulse (!) 52   Temp 98 F (36.7 C) (Oral)   Resp 18   Ht 5' 8.5" (1.74 m)   Wt 114 kg   SpO2 98%   BMI 37.66 kg/m  Physical Exam Constitutional:      General: He is not in acute distress. HENT:     Head: Normocephalic and atraumatic.  Eyes:     Conjunctiva/sclera: Conjunctivae normal.     Pupils: Pupils are equal, round, and reactive to light.  Cardiovascular:     Rate and Rhythm: Normal rate and regular rhythm.  Pulmonary:     Effort: Pulmonary effort is normal. No respiratory distress.  Abdominal:     General: There is no distension.     Tenderness: There is no abdominal tenderness.  Skin:    General: Skin is warm and dry.  Neurological:     Mental Status: He is alert.     Comments: Oriented to person and place, not to year Remainder of neurological exam and CN exam intact  Psychiatric:        Mood and Affect: Mood normal.        Behavior:  Behavior normal.     ED Results / Procedures / Treatments   Labs (all labs ordered are listed, but only abnormal results are displayed) Labs Reviewed  BASIC METABOLIC PANEL - Abnormal; Notable for  the following components:      Result Value   Potassium 3.4 (*)    Glucose, Bld 131 (*)    BUN 38 (*)    Creatinine, Ser 2.27 (*)    GFR, Estimated 29 (*)    All other components within normal limits  CBC WITH DIFFERENTIAL/PLATELET - Abnormal; Notable for the following components:   Neutro Abs 8.2 (*)    All other components within normal limits  CBG MONITORING, ED - Abnormal; Notable for the following components:   Glucose-Capillary 43 (*)    All other components within normal limits  CBG MONITORING, ED - Abnormal; Notable for the following components:   Glucose-Capillary 121 (*)    All other components within normal limits  CBG MONITORING, ED - Abnormal; Notable for the following components:   Glucose-Capillary 59 (*)    All other components within normal limits  ETHANOL  URINALYSIS, ROUTINE W REFLEX MICROSCOPIC  CBG MONITORING, ED  CBG MONITORING, ED  CBG MONITORING, ED    EKG EKG Interpretation Date/Time:  Monday April 13 2023 18:08:05 EST Ventricular Rate:  50 PR Interval:  214 QRS Duration:  176 QT Interval:  508 QTC Calculation: 464 R Axis:   -55  Text Interpretation: Sinus rhythm Atrial premature complex RBBB and LAFB Left ventricular hypertrophy Confirmed by Alvester Chou [phone]) on 04/13/2023 8:18:43 PM  Radiology CT Head Wo Contrast  Result Date: 04/13/2023 CLINICAL DATA:  Altered mental status. EXAM: CT HEAD WITHOUT CONTRAST TECHNIQUE: Contiguous axial images were obtained from the base of the skull through the vertex without intravenous contrast. RADIATION DOSE REDUCTION: This exam was performed according to the departmental dose-optimization program which includes automated exposure control, adjustment of the mA and/or kV according to patient size and/or  use of iterative reconstruction technique. COMPARISON:  None Available. FINDINGS: Brain: There is mild cerebral atrophy with widening of the extra-axial spaces and ventricular dilatation. There are areas of decreased attenuation within the white matter tracts of the supratentorial brain, consistent with microvascular disease changes. Vascular: No hyperdense vessel or unexpected calcification. Skull: Normal. Negative for fracture or focal lesion. Sinuses/Orbits: No acute finding. Other: None. IMPRESSION: 1. Generalized cerebral atrophy with chronic white matter small vessel ischemic changes. 2. No acute intracranial abnormality. Electronically Signed   By: Aram Candela M.D.   On: 04/13/2023 20:50    Procedures Procedures    Medications Ordered in ED Medications  sodium chloride flush (NS) 0.9 % injection 3 mL (3 mLs Intravenous Given 04/13/23 2221)  sodium chloride flush (NS) 0.9 % injection 3 mL (has no administration in time range)  0.9 %  sodium chloride infusion (250 mLs Intravenous New Bag/Given 04/13/23 1819)    ED Course/ Medical Decision Making/ A&P Clinical Course as of 04/13/23 2246  Mon Apr 13, 2023  1803 Patient immediately evaluated by PA provider in triage as well as by myself with concern for acute onset of confusion.  He was found to have a glucose of 43.  He was given some juice and is now eating a sandwich, and will be reevaluated, I suspect this likely symptomatic hypoglycemia.  He does not have localizing stroke symptoms otherwise -but given his age and concern for acute mental status change, CT scan of the head was ordered.  Code stroke was not activated based on his neurological exam. No evidence of LVO [MT]  1920 Reassessed and completely back to baseline mental status, BS normalized, wife at bedside [MT]  2232 Glucose 59 - will admit for  hypogylcemia monitoring overnight - pt still mentating normally, will give more food [MT]  2245 Admitted to Dr Allena Katz hospitalist  [MT]     Clinical Course User Index [MT] Renaye Rakers Kermit Balo, MD                                 Medical Decision Making Amount and/or Complexity of Data Reviewed Labs: ordered. Radiology: ordered.  Risk Prescription drug management. Decision regarding hospitalization.   This patient presents to the ED with concern for confusion. This involves an extensive number of treatment options, and is a complaint that carries with it a high risk of complications and morbidity.  The differential diagnosis includes hypoglycemia versus CVA versus metabolic encephalopathy versus infection versus other  Co-morbidities that complicate the patient evaluation: History of diabetes, at risk for metabolic derangement  Additional history obtained from the patient's wife at bedside   I ordered and personally interpreted labs.  The pertinent results include: Initial hypoglycemia.  Glucose improved with food and then subsequently has trended back down to 59.  Patient has creatinine of 2.27 and BUN of 38.  There were no recent baselines for comparison.  I ordered imaging studies including CT scan of the head I independently visualized and interpreted imaging which showed no emergent findings I agree with the radiologist interpretation  The patient was maintained on a cardiac monitor.  I personally viewed and interpreted the cardiac monitored which showed an underlying rhythm of: Sinus rhythm  Per my interpretation the patient's ECG shows no acute ischemic findings  I have reviewed the patients home medicines and have made adjustments as needed  After the interventions noted above, I reevaluated the patient and found that they have: improved - mental status improved back to normal.    Dispostion:  After consideration of the diagnostic results and the patients response to treatment, I feel that the patent would benefit from medical admission.  Given that the patient's blood sugars trending downwards despite  eating food and drinking juice in the ED, we have discussed observation admission in the hospital for glucose monitoring overnight.  The patient and his wife are in agreement.         Final Clinical Impression(s) / ED Diagnoses Final diagnoses:  Hypoglycemia    Rx / DC Orders ED Discharge Orders     None         Andreina Outten, Kermit Balo, MD 04/13/23 2246

## 2023-04-13 NOTE — Hospital Course (Addendum)
Jason Garrett is a 77 y.o. male with medical history significant for non-insulin-dependent T2DM, HTN, HLD who presented to [facility] emergency department due to confusion.   Upon evaluation in the emergency department patient was found to have substantial hypoglycemia with blood sugar 43.  The hospitalist was then called to assess patient for admission the hospital for encephalopathy and hypoglycemia.  Patient was noted to be on glimepiride in the outpatient setting.  This was held.  Patient's hypoglycemia was corrected with dextrose.  This fluids were additionally administered and associated electrolyte abnormalities were corrected.  Over the course of the hospitalization patient's blood sugars did improve as did his mentation.  Patient was advised to stop taking glimepiride unless otherwise instructed by his outpatient provider.  Patient was discharged home in improved and stable condition on 04/15/2023.

## 2023-04-13 NOTE — ED Triage Notes (Signed)
Patient is here for evaluation after being dizzy around 1200. Patient ate lunch, everything was fine. Went to work in his office and around 1630 wife found all his computer screens were black and she asked him what was going on and he said "I dont know." No facial droop, grip strength is normal, unable to recall the year but alert and oriented to person, place and situation. CBG 43 in triage, given orange juice.

## 2023-04-13 NOTE — H&P (Signed)
History and Physical    Jason Garrett ZOX:[id number] DOB: [birth year] DOA: 04/13/2023  PCP: Burton Apley, MD  Patient coming from: Home  I have personally briefly reviewed patient's old medical records in [facility]  Chief Complaint: Altered mental status  HPI: Jason Garrett is a 77 y.o. male with medical history significant for non-insulin-dependent T2DM, HTN, HLD who presented to the ED for evaluation of altered mental status.  Patient states he was in his usual state of health this morning.  Prior to lunch she felt dizzy and came downstairs in the house to eat as this often is a sign that his blood sugars getting low.  He had a sandwich, grapes, and ginger ale for lunch.  Afterwards he took an hour-long nap, he notes that this is a little bit longer than his usual naps.  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  Around 4:30 PM his wife came to his office to check in on him and found that he was sitting in front of the computer screens, just staring straight ahead.  When she interacted with him he suddenly seemed very confused.  He was not really able to answer any specific questions to her and seemed disoriented.  She noted that he appeared to be sweating on the back of his neck.  He did not have any obvious slurred speech, facial droop, or weakness in his extremities.  He was subsequently brought to the ED for further evaluation.  Patient states that his hemoglobin A1c has been very well-controlled, last check was around 5.2%.  He says his only medication for diabetes is glimepiride 2.5 mg daily.  He is certain he has not taken extra as he uses a pillbox for his daily medications.  He does not take insulin.  He does note an unintentional weight loss of approximately 15 pounds over the last 1-2 months.  He has not had any night sweats or fevers.  He denies nausea, vomiting, abdominal pain, chest  pain, dyspnea.  Patient also reports that he has some degree of chronic kidney disease but not sure what his creatinine or CKD stage is at baseline.  ED Course  Labs/Imaging on admission: I have personally reviewed following labs and imaging studies.  Initial vitals showed BP 127/72, pulse 61, RR 16, temp 97.5 F, SpO2 100% on room air.  Initial CBG 43.  Sodium 136, potassium 3.4, bicarb 23, BUN 38, creatinine 2.27, serum glucose 131, serum ethanol <10, WBC 9.8, hemoglobin 14.2, platelets 196,000.  Patient was given orange juice and food.  CBG trended 43 > 121 > 96 >90 > 74 > 59.  CT head without contrast negative for acute intracranial abnormality.  Generalized cerebral atrophy with chronic white matter small vessel ischemic changes noted.  Due to recurrent hypoglycemia the hospitalist service was consulted to admit for further evaluation.  Review of Systems: All systems reviewed and are negative except as documented in history of present illness above.   Past Medical History:  Diagnosis Date   Diabetes mellitus without complication (HCC)    Hyperlipidemia    Hypertension    Pneumonia 1996   walking pneumonia    Past Surgical History:  Procedure Laterality Date   CHOLECYSTECTOMY N/A 07/14/2012   Procedure: LAPAROSCOPIC CHOLECYSTECTOMY WITH INTRAOPERATIVE CHOLANGIOGRAM;  Surgeon: Wilmon Arms. Corliss Skains, MD;  Location: [facility];  Service: General;  Laterality: N/A;  HERNIA REPAIR  1950   TONSILLECTOMY     ULNAR NERVE REPAIR  1997    Social History:  reports that he has never smoked. He does not have any smokeless tobacco history on file. He reports current alcohol use. He reports that he does not use drugs.  Allergies  Allergen Reactions   Prilocaine Hives    Family History  Problem Relation Age of Onset   Cancer Father        skin     Prior to Admission medications   Medication Sig Start Date End Date Taking? Authorizing Provider  aspirin 81 MG tablet Take 81 mg by mouth  daily.    [provider]  cephALEXin (KEFLEX) 500 MG capsule Take 1 capsule (500 mg total) by mouth 4 (four) times daily. 10/08/22     chlorthalidone (HYGROTON) 25 MG tablet Take 25 mg by mouth daily before breakfast.     [provider]  chlorthalidone (HYGROTON) 25 MG tablet Take 1 tablet by mouth once daily 08/06/20     chlorthalidone (HYGROTON) 25 MG tablet Take 1 tablet (25 mg total) by mouth daily. 08/13/22     COVID-19 mRNA bivalent vaccine, Pfizer, (PFIZER COVID-19 VAC BIVALENT) injection Inject into the muscle. 03/26/21   Judyann Munson, MD  COVID-19 mRNA Vac-TriS, Pfizer, (PFIZER-BIONT COVID-19 VAC-TRIS) SUSP injection Inject into the muscle. 10/02/20   Judyann Munson, MD  COVID-19 mRNA vaccine [id number] (COMIRNATY) syringe Inject into the muscle. 04/04/22   Judyann Munson, MD  doxazosin (CARDURA) 1 MG tablet Take 1 mg by mouth 2 (two) times daily.     [provider]  doxazosin (CARDURA) 1 MG tablet Take 1 tablet (1 mg total) by mouth daily for blood pressure. 08/13/22     doxazosin (CARDURA) 1 MG tablet TAKE 1 TABLET BY MOUTH ONCE DAILY BLOOD PRESSURE 07/05/19 07/04/20  Burton Apley, MD  doxazosin (CARDURA) 1 MG tablet Take 1 tablet (1 mg total) by mouth daily 09/03/20     glimepiride (AMARYL) 2 MG tablet Take 1 mg by mouth daily before breakfast.     [provider]  glimepiride (AMARYL) 2 MG tablet Take 1 tablet by mouth daily for diabetes mellitus. 08/07/21     glimepiride (AMARYL) 2 MG tablet Take 1 tablet (2 mg total) by mouth daily. 08/13/22     glucose blood test strip Use to test blood sugar daily as directed 09/03/20     glucose blood test strip Use to test daily as directed 08/06/20   Burton Apley, MD  losartan (COZAAR) 25 MG tablet Take 25 mg by mouth at bedtime.     [provider]  losartan (COZAAR) 25 MG tablet Take 1 tablet (25 mg total) by mouth daily. 08/06/20     losartan (COZAAR) 25 MG tablet Take 1 tablet (25 mg total) by mouth daily.  08/13/22     losartan (COZAAR) 25 MG tablet Take 1 tablet (25 mg total) by mouth daily. 08/06/20   Burton Apley, MD  RSV vaccine recomb adjuvanted (AREXVY) 120 MCG/0.5ML injection Inject into the muscle. 06/12/22   Judyann Munson, MD  simvastatin (ZOCOR) 20 MG tablet Take 20 mg by mouth daily before breakfast.     [provider]  simvastatin (ZOCOR) 20 MG tablet Take 1 tablet by mouth daily 08/07/21     simvastatin (ZOCOR) 20 MG tablet Take 1 tablet (20 mg total) by mouth daily. 08/13/22     simvastatin (ZOCOR) 20 MG tablet Take 1 tablet (20 mg  total) by mouth daily. 08/06/20     tadalafil (CIALIS) 20 MG tablet Take 1 tablet (20 mg total) by mouth daily as directed 08/13/22     tadalafil (CIALIS) 20 MG tablet Take 1 tablet by mouth daily 09/03/20   Burton Apley, MD  vardenafil (LEVITRA) 20 MG tablet Take 1 tablet (20 mg total) by mouth daily as directed 01/14/21     vardenafil (LEVITRA) 20 MG tablet TAKE 1 TABLET BY MOUTH DAILY AS DIRECTED 11/08/19 11/07/20  Burton Apley, MD  vardenafil (LEVITRA) 20 MG tablet Take 1 tablet by mouth daily as directed 09/03/20   Burton Apley, MD  verapamil (CALAN-SR) 240 MG CR tablet Take 1 tablet by mouth daily 08/07/21     verapamil (CALAN-SR) 240 MG CR tablet Take 1 tablet (240 mg total) by mouth daily. 08/06/20     verapamil (COVERA HS) 240 MG (CO) 24 hr tablet Take 240 mg by mouth daily before breakfast.     [provider]    Physical Exam: Vitals:   04/13/23 2200 04/13/23 2227 04/13/23 2230 04/13/23 2300  BP: 112/72  127/81 134/71  Pulse: (!) 56  (!) 57 (!) 57  Resp:   18 18  Temp:  98 F (36.7 C)    TempSrc:  Oral    SpO2: 97%  97% 98%  Weight:      Height:       Constitutional: Resting in bed, NAD, calm, comfortable Eyes: EOMI, lids and conjunctivae normal ENMT: Mucous membranes are moist. Posterior pharynx clear of any exudate or lesions.Normal dentition.  Neck: normal, supple, no masses. Respiratory: clear to auscultation  bilaterally, no wheezing, no crackles. Normal respiratory effort. No accessory muscle use.  Cardiovascular: Borderline bradycardia, no murmurs / rubs / gallops. No extremity edema. 2+ pedal pulses. Abdomen: no tenderness, no masses palpated.  Musculoskeletal: no clubbing / cyanosis. No joint deformity upper and lower extremities. Good ROM, no contractures. Normal muscle tone.  Skin: no rashes, lesions, ulcers. No induration Neurologic: CN 2-12 grossly intact. Sensation intact. Strength 5/5 in all 4.  Psychiatric: Normal judgment and insight. Alert and oriented x 3. Normal mood.   EKG: Personally reviewed. Sinus bradycardia rate 50, first-degree AV block, RBBB and LAFB, PVC present.  No prior for comparison.  Assessment/Plan Principal Problem:   Hypoglycemia associated with type 2 diabetes mellitus (HCC) Active Problems:   Renal insufficiency   Hypokalemia   Hypertension associated with diabetes (HCC)   Hyperlipidemia associated with type 2 diabetes mellitus (HCC)   Jason Garrett is a 77 y.o. male with medical history significant for non-insulin-dependent T2DM, HTN, HLD who is admitted for symptomatic hypoglycemia. *** Assessment and Plan: Symptomatic hypoglycemia in setting of non-insulin-dependent type 2 diabetes: Initial CBG 43, improved with orange juice and oral intake but became hypoglycemic again with CBG down to 59 despite eating a meal.  Patient reports diabetes usually well-controlled at baseline with last A1c around 5.2%.  He takes glimepiride 2.5 mg daily, does not use insulin.  Altered mental status has resolved with correction of hypoglycemia. -Continue CBG checks q2h overnight -If develops recurrent hypoglycemia then would consider starting on low rate D5-1/2 NS infusion -Hypoglycemia protocol -Hold glimepiride  Renal insufficiency: Creatinine 2.27 on admission, only previous labs in our system work from 2014 at which time creatinine was 1.63.  He does report that he has  some degree of renal dysfunction but not sure what his creatinine or GFR runs at baseline.  Suspect has CKD stage IIIb or  borderline stage IV. -Hold losartan tonight, recheck labs in a.m.  Hypokalemia: Supplementing.  Hypertension: BP is stable.  Holding losartan as above.***  Hyperlipidemia: Continue simvastatin.   DVT prophylaxis: enoxaparin (LOVENOX) injection 40 mg Start: 04/14/23 2200 Code Status:   Code Status: Limited: Do not attempt resuscitation (DNR) -DNR-LIMITED -Do Not Intubate/DNI   confirmed with patient on admission. Family Communication: Spouse at bedside. Disposition Plan: From home and likely discharge to home pending clinical progress Consults called: None Severity of Illness: The appropriate patient status for this patient is OBSERVATION. Observation status is judged to be reasonable and necessary in order to provide the required intensity of service to ensure the patient's safety. The patient's presenting symptoms, physical exam findings, and initial radiographic and laboratory data in the context of their medical condition is felt to place them at decreased risk for further clinical deterioration. Furthermore, it is anticipated that the patient will be medically stable for discharge from the hospital within 2 midnights of admission.   Darreld Mclean MD [facility]  If 7PM-7AM, please contact night-coverage [email or web address]  04/13/2023, 11:59 PM

## 2023-04-14 DIAGNOSIS — E11649 Type 2 diabetes mellitus with hypoglycemia without coma: Secondary | ICD-10-CM | POA: Diagnosis not present

## 2023-04-14 LAB — GLUCOSE, CAPILLARY
Glucose-Capillary: 65 mg/dL — ABNORMAL LOW (ref 70–99)
Glucose-Capillary: 124 mg/dL — ABNORMAL HIGH (ref 70–99)
Glucose-Capillary: 98 mg/dL (ref 70–99)
Glucose-Capillary: 72 mg/dL (ref 70–99)
Glucose-Capillary: 78 mg/dL (ref 70–99)
Glucose-Capillary: 158 mg/dL — ABNORMAL HIGH (ref 70–99)
Glucose-Capillary: 109 mg/dL — ABNORMAL HIGH (ref 70–99)
Glucose-Capillary: 107 mg/dL — ABNORMAL HIGH (ref 70–99)
Glucose-Capillary: 115 mg/dL — ABNORMAL HIGH (ref 70–99)
Glucose-Capillary: 88 mg/dL (ref 70–99)
Glucose-Capillary: 193 mg/dL — ABNORMAL HIGH (ref 70–99)

## 2023-04-14 LAB — BASIC METABOLIC PANEL
Anion gap: 9 (ref 5–15)
BUN: 38 mg/dL — ABNORMAL HIGH (ref 8–23)
CO2: 22 mmol/L (ref 22–32)
Calcium: 10 mg/dL (ref 8.9–10.3)
Chloride: 107 mmol/L (ref 98–111)
Creatinine, Ser: 2.01 mg/dL — ABNORMAL HIGH (ref 0.61–1.24)
GFR, Estimated: 33 mL/min — ABNORMAL LOW (ref >60)
Glucose, Bld: 76 mg/dL (ref 70–99)
Potassium: 3.3 mmol/L — ABNORMAL LOW (ref 3.5–5.1)
Sodium: 138 mmol/L (ref 135–145)

## 2023-04-14 LAB — CBC
HCT: 38.9 % — ABNORMAL LOW (ref 39.0–52.0)
Hemoglobin: 13.1 g/dL (ref 13.0–17.0)
MCH: 31 pg (ref 26.0–34.0)
MCHC: 33.7 g/dL (ref 30.0–36.0)
MCV: 92 fL (ref 80.0–100.0)
Platelets: 217 10*3/uL (ref 150–400)
RBC: 4.23 MIL/uL (ref 4.22–5.81)
RDW: 13.1 % (ref 11.5–15.5)
WBC: 6.5 10*3/uL (ref 4.0–10.5)
nRBC: 0 % (ref 0.0–0.2)

## 2023-04-14 LAB — URINALYSIS, ROUTINE W REFLEX MICROSCOPIC
Bilirubin Urine: NEGATIVE
Glucose, UA: NEGATIVE mg/dL
Hgb urine dipstick: NEGATIVE
Ketones, ur: NEGATIVE mg/dL
Leukocytes,Ua: NEGATIVE
Nitrite: NEGATIVE
Protein, ur: NEGATIVE mg/dL
Specific Gravity, Urine: 1.006 (ref 1.005–1.030)
pH: 6 (ref 5.0–8.0)

## 2023-04-14 LAB — HEMOGLOBIN A1C
Hgb A1c MFr Bld: 5 % (ref 4.8–5.6)
Mean Plasma Glucose: 96.8 mg/dL

## 2023-04-14 MED ORDER — DEXTROSE-SODIUM CHLORIDE 5-0.9 % IV SOLN: INTRAVENOUS | Status: DC

## 2023-04-14 MED ORDER — SIMVASTATIN 20 MG PO TABS
20.0000 mg | ORAL_TABLET | Freq: Every day | ORAL | Status: DC
  Administered 2023-04-14 (×2): 20 mg via ORAL
  Filled 2023-04-14 (×2): qty 1

## 2023-04-14 NOTE — Plan of Care (Signed)

## 2023-04-14 NOTE — Progress Notes (Signed)
PROGRESS NOTE    Jason Garrett  ZOX:[id number] DOB: 1946/05/07 DOA: 04/13/2023 PCP: Burton Apley, MD   Brief Narrative:  HPI: Jason Garrett is a 77 y.o. male with medical history significant for non-insulin-dependent T2DM, HTN, HLD who presented to the ED for evaluation of altered mental status.   Patient states he was in his usual state of health this morning.  Prior to lunch she felt dizzy and came downstairs in the house to eat as this often is a sign that his blood sugars getting low.  He had a sandwich, grapes, and ginger ale for lunch.   Afterwards he took an hour-long nap, he notes that this is a little bit longer than his usual naps.   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   Around 4:30 PM his wife came to his office to check in on him and found that he was sitting in front of the computer screens, just staring straight ahead.  When she interacted with him he suddenly seemed very confused.  He was not really able to answer any specific questions to her and seemed disoriented.  She noted that he appeared to be sweating on the back of his neck.  He did not have any obvious slurred speech, facial droop, or weakness in his extremities.  He was subsequently brought to the ED for further evaluation.   Patient states that his hemoglobin A1c has been very well-controlled, last check was around 5.2%.  He says his only medication for diabetes is glimepiride 2.5 mg daily.  He is certain he has not taken extra as he uses a pillbox for his daily medications.  He does not take insulin.  He does note an unintentional weight loss of approximately 15 pounds over the last 1-2 months.  He has not had any night sweats or fevers.  He denies nausea, vomiting, abdominal pain, chest pain, dyspnea.  Patient also reports that he has some degree of chronic kidney disease but not sure what his creatinine or CKD stage is at  baseline.   ED Course  Labs/Imaging on admission: I have personally reviewed following labs and imaging studies.   Initial vitals showed BP 127/72, pulse 61, RR 16, temp 97.5 F, SpO2 100% on room air.   Initial CBG 43.  Sodium 136, potassium 3.4, bicarb 23, BUN 38, creatinine 2.27, serum glucose 131, serum ethanol <10, WBC 9.8, hemoglobin 14.2, platelets 196,000.   Patient was given orange juice and food.  CBG trended 43 > 121 > 96 >90 > 74 > 59.   CT head without contrast negative for acute intracranial abnormality.  Generalized cerebral atrophy with chronic white matter small vessel ischemic changes noted.   Due to recurrent hypoglycemia the hospitalist service was consulted to admit for further evaluation.  Assessment & Plan:   Principal Problem:   Hypoglycemia associated with type 2 diabetes mellitus (HCC) Active Problems:   Renal insufficiency   Hypokalemia   Hypertension associated with diabetes (HCC)   Hyperlipidemia associated with type 2 diabetes mellitus (HCC)  Acute metabolic/hypoglycemic encephalopathy secondary to symptomatic hypoglycemia in setting of non-insulin-dependent type 2 diabetes: Initial CBG 43, improved with orange juice and oral intake but became hypoglycemic again with CBG down to 59 despite eating a meal.  Patient reports diabetes usually well-controlled at baseline with last A1c around 5.2%.  Despite of that he takes glimepiride 2.5  mg daily, does not use insulin.  Altered mental status has resolved with correction of hypoglycemia.  Blood sugar dropped to 50s despite of being on dextrose but around 150 this morning when I saw him after breakfast.  Will trial discontinuing dextrose and monitoring today, due to risk of hypoglycemia, will observe overnight.  I had a lengthy discussion with the patient.  Since his last hemoglobin A1c was 5.2, he really does not need any antiglycemics as the goal hemoglobin A1c is 7%.  I recommended not taking these medications at  discharge.   CKD stage IV: Creatinine 2.27 on admission, only previous labs in our system work from 2014 at which time creatinine was 1.63.  However after my discussion with the patient, he tells me that his recent hemoglobin A1c at his PCPs office was 2.3 and that he has been recommended to visit nephrology.  Based on this data, his creatinine which has improved to 2.01 is actually better than his baseline.  AKI ruled out.    Hypokalemia: Low again, will replenish.   Hypertension: BP is stable.  Holding losartan    Hyperlipidemia: Continue simvastatin.  DVT prophylaxis: enoxaparin (LOVENOX) injection 40 mg Start: 04/14/23 2200   Code Status: Limited: Do not attempt resuscitation (DNR) -DNR-LIMITED -Do Not Intubate/DNI   Family Communication:  None present at bedside.  Plan of care discussed with patient in length and he/she verbalized understanding and agreed with it.  Status is: Observation The patient will require care spanning > 2 midnights and should be moved to inpatient because: Recurrent hypoglycemia, needs extended observation.   Estimated body mass index is 37.66 kg/m as calculated from the following:   Height as of this encounter: 5' 8.5" (1.74 m).   Weight as of this encounter: 114 kg.    Nutritional Assessment: Body mass index is 37.66 kg/m.Marland Kitchen Seen by dietician.  I agree with the assessment and plan as outlined below: Nutrition Status:        . Skin Assessment: I have examined the patient's skin and I agree with the wound assessment as performed by the wound care RN as outlined below:    Consultants:  None  Procedures:  None  Antimicrobials:  Anti-infectives (From admission, onward)    None         Subjective: Patient seen and examined.  He has no complaints.  Objective: Vitals:   04/14/23 0202 04/14/23 0533 04/14/23 0943 04/14/23 1341  BP: 124/73 121/77 116/74 128/80  Pulse: (!) 58 (!) 54 71 62  Resp: 20 20 18 16   Temp: 98.5 F (36.9 C)  98.4 F (36.9 C) 98.2 F (36.8 C) 98.1 F (36.7 C)  TempSrc: Oral Oral    SpO2: 98% 99% 99% 97%  Weight:      Height:        Intake/Output Summary (Last 24 hours) at 04/14/2023 1423 Last data filed at 04/14/2023 1000 Gross per 24 hour  Intake 362.33 ml  Output 300 ml  Net 62.33 ml   Filed Weights   04/13/23 1739  Weight: 114 kg    Examination:  General exam: Appears calm and comfortable  Respiratory system: Clear to auscultation. Respiratory effort normal. Cardiovascular system: S1 & S2 heard, RRR. No JVD, murmurs, rubs, gallops or clicks. No pedal edema. Gastrointestinal system: Abdomen is nondistended, soft and nontender. No organomegaly or masses felt. Normal bowel sounds heard. Central nervous system: Alert and oriented. No focal neurological deficits. Extremities: Symmetric 5 x 5 power. Skin: No rashes, lesions or  ulcers Psychiatry: Judgement and insight appear normal. Mood & affect appropriate.    Data Reviewed: I have personally reviewed following labs and imaging studies  CBC: Recent Labs  Lab 04/13/23 1815 04/14/23 0512  WBC 9.8 6.5  NEUTROABS 8.2*  --   HGB 14.2 13.1  HCT 42.0 38.9*  MCV 93.1 92.0  PLT 196 217   Basic Metabolic Panel: Recent Labs  Lab 04/13/23 1815 04/14/23 0512  NA 136 138  K 3.4* 3.3*  CL 103 107  CO2 23 22  GLUCOSE 131* 76  BUN 38* 38*  CREATININE 2.27* 2.01*  CALCIUM 9.9 10.0   GFR: Estimated Creatinine Clearance: 37.4 mL/min (A) (by C-G formula based on SCr of 2.01 mg/dL (H)). Liver Function Tests: No results for input(s): "AST", "ALT", "ALKPHOS", "BILITOT", "PROT", "ALBUMIN" in the last 168 hours. No results for input(s): "LIPASE", "AMYLASE" in the last 168 hours. No results for input(s): "AMMONIA" in the last 168 hours. Coagulation Profile: No results for input(s): "INR", "PROTIME" in the last 168 hours. Cardiac Enzymes: No results for input(s): "CKTOTAL", "CKMB", "CKMBINDEX", "TROPONINI" in the last 168  hours. BNP (last 3 results) No results for input(s): "PROBNP" in the last 8760 hours. HbA1C: No results for input(s): "HGBA1C" in the last 72 hours. CBG: Recent Labs  Lab 04/14/23 0534 04/14/23 0731 04/14/23 0929 04/14/23 1128 04/14/23 1252  GLUCAP 72 78 158* 109* 107*   Lipid Profile: No results for input(s): "CHOL", "HDL", "LDLCALC", "TRIG", "CHOLHDL", "LDLDIRECT" in the last 72 hours. Thyroid Function Tests: No results for input(s): "TSH", "T4TOTAL", "FREET4", "T3FREE", "THYROIDAB" in the last 72 hours. Anemia Panel: No results for input(s): "VITAMINB12", "FOLATE", "FERRITIN", "TIBC", "IRON", "RETICCTPCT" in the last 72 hours. Sepsis Labs: No results for input(s): "PROCALCITON", "LATICACIDVEN" in the last 168 hours.  No results found for this or any previous visit (from the past 240 hour(s)).   Radiology Studies: CT Head Wo Contrast  Result Date: 04/13/2023 CLINICAL DATA:  Altered mental status. EXAM: CT HEAD WITHOUT CONTRAST TECHNIQUE: Contiguous axial images were obtained from the base of the skull through the vertex without intravenous contrast. RADIATION DOSE REDUCTION: This exam was performed according to the departmental dose-optimization program which includes automated exposure control, adjustment of the mA and/or kV according to patient size and/or use of iterative reconstruction technique. COMPARISON:  None Available. FINDINGS: Brain: There is mild cerebral atrophy with widening of the extra-axial spaces and ventricular dilatation. There are areas of decreased attenuation within the white matter tracts of the supratentorial brain, consistent with microvascular disease changes. Vascular: No hyperdense vessel or unexpected calcification. Skull: Normal. Negative for fracture or focal lesion. Sinuses/Orbits: No acute finding. Other: None. IMPRESSION: 1. Generalized cerebral atrophy with chronic white matter small vessel ischemic changes. 2. No acute intracranial abnormality.  Electronically Signed   By: Aram Candela M.D.   On: 04/13/2023 20:50    Scheduled Meds:  enoxaparin (LOVENOX) injection  40 mg Subcutaneous Q24H   simvastatin  20 mg Oral QHS   sodium chloride flush  3 mL Intravenous Q12H   Continuous Infusions:  sodium chloride 10 mL/hr at 04/14/23 1000     LOS: 0 days   Hughie Closs, MD [facility]  04/14/2023, 2:23 PM   *Please note that this is a verbal dictation therefore any spelling or grammatical errors are due to the "[employer]agon [employer] One" system interpretation.  Please page via Amion and do not message via secure chat for urgent patient care matters. Secure chat can be used for  non urgent patient care matters.  How to contact the [facility] Attending or Consulting provider 7A - 7P or covering provider during after hours 7P -7A, for this patient?  Check the care team in [facility] and look for a) attending/consulting [facility] provider listed and b) the [facility] team listed. Page or secure chat 7A-7P. Log into [email or web address] and use [place]'s universal password to access. If you do not have the password, please contact the hospital operator. Locate the [facility] provider you are looking for under [facility] and page to a number that you can be directly reached. If you still have difficulty reaching the provider, please page the [facility] (Director on Call) for the Hospitalists listed on amion for assistance.

## 2023-04-14 NOTE — Progress Notes (Signed)
Hypoglycemic Event  CBG: 65  Treatment: 8 oz juice/soda  Symptoms: None  Follow-up CBG: Time:0235 CBG Result:124   Possible Reasons for Event: Unknown  Comments/MD notified:Daniels NP notified.    Ronnald Collum RN BSN

## 2023-04-14 NOTE — Care Management Obs Status (Signed)
MEDICARE OBSERVATION STATUS NOTIFICATION   Patient Details  Name: Jason Garrett MRN: [id number] Date of Birth: 1945-10-16   Medicare Observation Status Notification Given:  Yes    Otelia Santee, LCSW 04/14/2023, 10:31 AM

## 2023-04-14 NOTE — Progress Notes (Signed)
   04/14/23 1103  TOC Brief Assessment  Insurance and Status Reviewed  Patient has primary care physician Yes  Home environment has been reviewed Home with spouse  Prior level of function: Independent  Prior/Current Home Services No current home services  Social Determinants of Health Reivew SDOH reviewed no interventions necessary  Readmission risk has been reviewed Yes  Transition of care needs no transition of care needs at this time

## 2023-04-14 NOTE — ED Notes (Signed)
ED TO INPATIENT HANDOFF REPORT  ED Nurse Name and Phone #: Evamaria Detore/[phone]  S Name/Age/Gender Jason Garrett 77 y.o. male Room/Bed: WA25/WA25  Code Status   Code Status: Limited: Do not attempt resuscitation (DNR) -DNR-LIMITED -Do Not Intubate/DNI   Home/SNF/Other Home Patient oriented to: self, place, time, and situation Is this baseline? Yes   Triage Complete: Triage complete  Chief Complaint Hypoglycemia associated with diabetes [facility]) [E11.649]  Triage Note Patient is here for evaluation after being dizzy around 1200. Patient ate lunch, everything was fine. Went to work in his office and around 1630 wife found all his computer screens were black and she asked him what was going on and he said "I dont know." No facial droop, grip strength is normal, unable to recall the year but alert and oriented to person, place and situation. CBG 43 in triage, given orange juice.   Allergies Allergies  Allergen Reactions   Prilocaine Hives    Level of Care/Admitting Diagnosis ED Disposition     ED Disposition  Admit   Condition  --   Comment  Hospital Area: [facility] [[id number]]  Level of Care: Telemetry [5]  Admit to tele based on following criteria: Other see comments  Comments: hypoglycemia, bradycardia  May place patient in observation at [facility] or [facility] if equivalent level of care is available:: No  Covid Evaluation: Asymptomatic - no recent exposure (last 10 days) testing not required  Diagnosis: Hypoglycemia associated with diabetes [facility]) [[id number]]  Admitting Physician: Charlsie Quest [[id number]]  Attending Physician: Charlsie Quest [[id number]]          B Medical/Surgery History Past Medical History:  Diagnosis Date   Diabetes mellitus without complication (HCC)    Hyperlipidemia    Hypertension    Pneumonia 1996   walking pneumonia   Past Surgical History:  Procedure Laterality Date   CHOLECYSTECTOMY N/A 07/14/2012    Procedure: LAPAROSCOPIC CHOLECYSTECTOMY WITH INTRAOPERATIVE CHOLANGIOGRAM;  Surgeon: Wilmon Arms. Corliss Skains, MD;  Location: [facility] ORS;  Service: General;  Laterality: N/A;   HERNIA REPAIR  1950   TONSILLECTOMY     ULNAR NERVE REPAIR  1997     A IV Location/Drains/Wounds Patient Lines/Drains/Airways Status     Active Line/Drains/Airways     Name Placement date Placement time Site Days   Peripheral IV 04/13/23 20 G 1" Anterior;Left Antecubital 04/13/23  1816  Antecubital  1   Incision - 4 Ports Abdomen 1: Superior 2: Right;Lower 3: Right;Upper 4: Medial 07/14/12  --  -- 3926            Intake/Output Last 24 hours No intake or output data in the 24 hours ending 04/14/23 0023  Labs/Imaging Results for orders placed or performed during the hospital encounter of 04/13/23 (from the past 48 hour(s))  CBG monitoring, ED     Status: Abnormal   Collection Time: 04/13/23  5:38 PM  Result Value Ref Range   Glucose-Capillary 43 (LL) 70 - 99 mg/dL    Comment: Glucose reference range applies only to samples taken after fasting for at least 8 hours.   Comment 1 Notify RN   CBG monitoring, ED (now and then every hour for 3 hours)     Status: Abnormal   Collection Time: 04/13/23  6:14 PM  Result Value Ref Range   Glucose-Capillary 121 (H) 70 - 99 mg/dL    Comment: Glucose reference range applies only to samples taken after fasting for at least 8 hours.  Basic  metabolic panel     Status: Abnormal   Collection Time: 04/13/23  6:15 PM  Result Value Ref Range   Sodium 136 135 - 145 mmol/L   Potassium 3.4 (L) 3.5 - 5.1 mmol/L   Chloride 103 98 - 111 mmol/L   CO2 23 22 - 32 mmol/L   Glucose, Bld 131 (H) 70 - 99 mg/dL    Comment: Glucose reference range applies only to samples taken after fasting for at least 8 hours.   BUN 38 (H) 8 - 23 mg/dL   Creatinine, Ser 2.95 (H) 0.61 - 1.24 mg/dL   Calcium 9.9 8.9 - 62.1 mg/dL   GFR, Estimated 29 (L) >60 mL/min    Comment: (NOTE) Calculated using the CKD-EPI  Creatinine Equation (2021)    Anion gap 10 5 - 15    Comment: Performed at [facility], [address] [place], [place] [address]  Ethanol     Status: None   Collection Time: 04/13/23  6:15 PM  Result Value Ref Range   Alcohol, Ethyl (B) <10 <10 mg/dL    Comment: (NOTE) Lowest detectable limit for serum alcohol is 10 mg/dL.  For medical purposes only. Performed at [facility], [address] [place], [place] [address]   CBC with Differential     Status: Abnormal   Collection Time: 04/13/23  6:15 PM  Result Value Ref Range   WBC 9.8 4.0 - 10.5 K/uL   RBC 4.51 4.22 - 5.81 MIL/uL   Hemoglobin 14.2 13.0 - 17.0 g/dL   HCT 62.9 52.8 - 41.3 %   MCV 93.1 80.0 - 100.0 fL   MCH 31.5 26.0 - 34.0 pg   MCHC 33.8 30.0 - 36.0 g/dL   RDW 24.4 01.0 - 27.2 %   Platelets 196 150 - 400 K/uL   nRBC 0.0 0.0 - 0.2 %   Neutrophils Relative % 83 %   Neutro Abs 8.2 (H) 1.7 - 7.7 K/uL   Lymphocytes Relative 11 %   Lymphs Abs 1.1 0.7 - 4.0 K/uL   Monocytes Relative 5 %   Monocytes Absolute 0.5 0.1 - 1.0 K/uL   Eosinophils Relative 1 %   Eosinophils Absolute 0.1 0.0 - 0.5 K/uL   Basophils Relative 0 %   Basophils Absolute 0.0 0.0 - 0.1 K/uL   Immature Granulocytes 0 %   Abs Immature Granulocytes 0.03 0.00 - 0.07 K/uL    Comment: Performed at [facility], [address] [place], [place] [address]  CBG monitoring, ED (now and then every hour for 3 hours)     Status: None   Collection Time: 04/13/23  7:17 PM  Result Value Ref Range   Glucose-Capillary 96 70 - 99 mg/dL    Comment: Glucose reference range applies only to samples taken after fasting for at least 8 hours.  CBG monitoring, ED (now and then every hour for 3 hours)     Status: None   Collection Time: 04/13/23  8:15 PM  Result Value Ref Range   Glucose-Capillary 90 70 - 99 mg/dL    Comment: Glucose reference range applies only to samples taken after fasting for at least 8  hours.  CBG monitoring, ED     Status: None   Collection Time: 04/13/23  9:33 PM  Result Value Ref Range   Glucose-Capillary 74 70 - 99 mg/dL    Comment: Glucose reference range applies only to samples taken after fasting for at least 8 hours.  POC CBG, ED     Status: Abnormal   Collection Time: 04/13/23 10:24 PM  Result Value Ref Range   Glucose-Capillary 59 (L) 70 - 99 mg/dL    Comment: Glucose reference range applies only to samples taken after fasting for at least 8 hours.  CBG monitoring, ED     Status: Abnormal   Collection Time: 04/13/23 11:19 PM  Result Value Ref Range   Glucose-Capillary 136 (H) 70 - 99 mg/dL    Comment: Glucose reference range applies only to samples taken after fasting for at least 8 hours.   CT Head Wo Contrast  Result Date: 04/13/2023 CLINICAL DATA:  Altered mental status. EXAM: CT HEAD WITHOUT CONTRAST TECHNIQUE: Contiguous axial images were obtained from the base of the skull through the vertex without intravenous contrast. RADIATION DOSE REDUCTION: This exam was performed according to the departmental dose-optimization program which includes automated exposure control, adjustment of the mA and/or kV according to patient size and/or use of iterative reconstruction technique. COMPARISON:  None Available. FINDINGS: Brain: There is mild cerebral atrophy with widening of the extra-axial spaces and ventricular dilatation. There are areas of decreased attenuation within the white matter tracts of the supratentorial brain, consistent with microvascular disease changes. Vascular: No hyperdense vessel or unexpected calcification. Skull: Normal. Negative for fracture or focal lesion. Sinuses/Orbits: No acute finding. Other: None. IMPRESSION: 1. Generalized cerebral atrophy with chronic white matter small vessel ischemic changes. 2. No acute intracranial abnormality. Electronically Signed   By: Aram Candela M.D.   On: 04/13/2023 20:50    Pending Labs Unresulted Labs  (From admission, onward)     Start     Ordered   04/14/23 0500  CBC  Tomorrow morning,   R        04/13/23 2346   04/14/23 0500  Basic metabolic panel  Tomorrow morning,   R        04/13/23 2346   04/13/23 1747  Urinalysis, Routine w reflex microscopic -Urine, Clean Catch  Once,   URGENT       Question:  Specimen Source  Answer:  Urine, Clean Catch   04/13/23 1746            Vitals/Pain Today's Vitals   04/13/23 2200 04/13/23 2227 04/13/23 2230 04/13/23 2300  BP: 112/72  127/81 134/71  Pulse: (!) 56  (!) 57 (!) 57  Resp:   18 18  Temp:  98 F (36.7 C)    TempSrc:  Oral    SpO2: 97%  97% 98%  Weight:      Height:      PainSc:        Isolation Precautions No active isolations  Medications Medications  sodium chloride flush (NS) 0.9 % injection 3 mL (3 mLs Intravenous Given 04/13/23 2221)  sodium chloride flush (NS) 0.9 % injection 3 mL (has no administration in time range)  0.9 %  sodium chloride infusion (250 mLs Intravenous New Bag/Given 04/13/23 1819)  enoxaparin (LOVENOX) injection 40 mg (has no administration in time range)  acetaminophen (TYLENOL) tablet 650 mg (has no administration in time range)    Or  acetaminophen (TYLENOL) suppository 650 mg (has no administration in time range)  ondansetron (ZOFRAN) tablet 4 mg (has no administration in time range)    Or  ondansetron (ZOFRAN) injection 4 mg (has no administration in time range)  senna-docusate (Senokot-S) tablet 1 tablet (has no administration in time range)  simvastatin (ZOCOR) tablet 20 mg (has no administration in time  range)  potassium chloride (KLOR-CON) packet 40 mEq (40 mEq Oral Given 04/14/23 0014)    Mobility walks      R Report given to: Barkley Bruns RN

## 2023-04-15 ENCOUNTER — Other Ambulatory Visit (HOSPITAL_COMMUNITY): Payer: Self-pay

## 2023-04-15 DIAGNOSIS — E11649 Type 2 diabetes mellitus with hypoglycemia without coma: Secondary | ICD-10-CM | POA: Diagnosis not present

## 2023-04-15 DIAGNOSIS — E1159 Type 2 diabetes mellitus with other circulatory complications: Secondary | ICD-10-CM

## 2023-04-15 DIAGNOSIS — N289 Disorder of kidney and ureter, unspecified: Secondary | ICD-10-CM

## 2023-04-15 DIAGNOSIS — E876 Hypokalemia: Secondary | ICD-10-CM | POA: Diagnosis not present

## 2023-04-15 DIAGNOSIS — E1169 Type 2 diabetes mellitus with other specified complication: Secondary | ICD-10-CM

## 2023-04-15 DIAGNOSIS — I152 Hypertension secondary to endocrine disorders: Secondary | ICD-10-CM

## 2023-04-15 DIAGNOSIS — E785 Hyperlipidemia, unspecified: Secondary | ICD-10-CM

## 2023-04-15 LAB — BASIC METABOLIC PANEL
Anion gap: 10 (ref 5–15)
BUN: 34 mg/dL — ABNORMAL HIGH (ref 8–23)
CO2: 22 mmol/L (ref 22–32)
Calcium: 10 mg/dL (ref 8.9–10.3)
Chloride: 106 mmol/L (ref 98–111)
Creatinine, Ser: 2.07 mg/dL — ABNORMAL HIGH (ref 0.61–1.24)
GFR, Estimated: 32 mL/min — ABNORMAL LOW (ref >60)
Glucose, Bld: 86 mg/dL (ref 70–99)
Potassium: 3.3 mmol/L — ABNORMAL LOW (ref 3.5–5.1)
Sodium: 138 mmol/L (ref 135–145)

## 2023-04-15 LAB — GLUCOSE, CAPILLARY
Glucose-Capillary: 70 mg/dL (ref 70–99)
Glucose-Capillary: 82 mg/dL (ref 70–99)

## 2023-04-15 MED ORDER — LISINOPRIL 20 MG PO TABS: 20.0000 mg | ORAL_TABLET | Freq: Every day | ORAL | Status: AC

## 2023-04-15 MED ORDER — POTASSIUM CHLORIDE CRYS ER 20 MEQ PO TBCR
20.0000 meq | EXTENDED_RELEASE_TABLET | Freq: Once | ORAL | Status: AC
  Administered 2023-04-15: 20 meq via ORAL
  Filled 2023-04-15: qty 1

## 2023-04-15 NOTE — Discharge Instructions (Addendum)
Please take all prescribed medications exactly as instructed including stopping your Clorthalidone and stopping your Glimepiride.   Please consume a low sodium diet Please increase your physical activity as tolerated. Please maintain all outpatient follow-up appointments including follow-up with your primary care provider.  Please discuss obtaining a referral to a nephrologist with your primary care provider for your chronic kidney disease. Please check your blood sugar while fasting every morning or whenever you are feeling weak or lightheaded.  Please document these blood sugars in a diary.  Please additionally check your blood pressure every morning and keep it in diary.  All of these numbers can be discussed with your primary care provider upon follow-up Please return to the emergency department if you develop worsening weakness persisting low blood sugar or inability to tolerate oral intake.

## 2023-04-15 NOTE — Plan of Care (Signed)

## 2023-04-15 NOTE — Progress Notes (Signed)
Mobility Specialist - Progress Note   04/15/23 1100  Mobility  Activity Ambulated with assistance in hallway  Level of Assistance Independent after set-up  Assistive Device None  Distance Ambulated (ft) 250 ft  Range of Motion/Exercises Active  Activity Response Tolerated well  Mobility Referral Yes  $Mobility charge 1 Mobility  Mobility Specialist Start Time (ACUTE ONLY) 1108  Mobility Specialist Stop Time (ACUTE ONLY) 1120  Mobility Specialist Time Calculation (min) (ACUTE ONLY) 12 min   Received in bed and agreed to mobility. Had no issues throughout session and returned to bed with all needs met.  Marilynne Halsted Mobility Specialist

## 2023-04-16 ENCOUNTER — Other Ambulatory Visit (HOSPITAL_COMMUNITY): Payer: Self-pay

## 2023-04-16 MED ORDER — GLUCOSE BLOOD VI STRP
ORAL_STRIP | 4 refills | Status: AC
  Filled 2023-04-16: qty 100, 100d supply, fill #0

## 2023-04-16 MED ORDER — FREESTYLE LANCETS MISC
4 refills | Status: AC
  Filled 2023-04-16 – 2023-06-09 (×2): qty 100, 100d supply, fill #0

## 2023-04-17 ENCOUNTER — Other Ambulatory Visit (HOSPITAL_COMMUNITY): Payer: Self-pay

## 2023-04-21 ENCOUNTER — Other Ambulatory Visit (HOSPITAL_COMMUNITY): Payer: Self-pay

## 2023-04-21 MED ORDER — LISINOPRIL 20 MG PO TABS
20.0000 mg | ORAL_TABLET | Freq: Every day | ORAL | 1 refills | Status: DC
  Filled 2023-04-21: qty 90, 90d supply, fill #0
  Filled 2023-06-29: qty 90, 90d supply, fill #1

## 2023-04-23 NOTE — Discharge Summary (Signed)
Physician Discharge Summary   Patient: Jason Garrett MRN: [id number] DOB: [birth year]  Admit date:     04/13/2023  Discharge date: 04/15/2023  Discharge Physician: Marinda Elk   PCP: Burton Apley, MD   Recommendations at discharge:   Please take all prescribed medications exactly as instructed including stopping your Clorthalidone and stopping your Glimepiride.   Please consume a low sodium diet Please increase your physical activity as tolerated. Please maintain all outpatient follow-up appointments including follow-up with your primary care provider.  Please discuss obtaining a referral to a nephrologist with your primary care provider for your chronic kidney disease. Please check your blood sugar while fasting every morning or whenever you are feeling weak or lightheaded.  Please document these blood sugars in a diary.  Please additionally check your blood pressure every morning and keep it in diary.  All of these numbers can be discussed with your primary care provider upon follow-up Please return to the emergency department if you develop worsening weakness persisting low blood sugar or inability to tolerate oral intake.  Discharge Diagnoses: Principal Problem:   Hypoglycemia associated with type 2 diabetes mellitus (HCC) Active Problems:   Renal insufficiency   Hypokalemia   Hypertension associated with diabetes (HCC)   Hyperlipidemia associated with type 2 diabetes mellitus (HCC)  Resolved Problems:   * No resolved hospital problems. *   Hospital Course: Jason Garrett is a 77 y.o. male with medical history significant for non-insulin-dependent T2DM, HTN, HLD who presented to [facility] emergency department due to confusion.   Upon evaluation in the emergency department patient was found to have substantial hypoglycemia with blood sugar 43.  The hospitalist was then called to assess patient for admission the hospital for encephalopathy and  hypoglycemia.  Patient was noted to be on glimepiride in the outpatient setting.  This was held.  Patient's hypoglycemia was corrected with dextrose.  This fluids were additionally administered and associated electrolyte abnormalities were corrected.  Over the course of the hospitalization patient's blood sugars did improve as did his mentation.  Patient was advised to stop taking glimepiride unless otherwise instructed by his outpatient provider.  Patient was discharged home in improved and stable condition on 04/15/2023.  Consultants: none Procedures performed: none  Disposition: Home Diet recommendation:  Discharge Diet Orders (From admission, onward)     Start     Ordered   04/15/23 0000  Diet - low sodium heart healthy        04/15/23 1333           Cardiac and Carb modified diet  DISCHARGE MEDICATION: Allergies as of 04/15/2023       Reactions   Prilocaine Hives        Medication List     STOP taking these medications    Arexvy 120 MCG/0.5ML injection Generic drug: RSV vaccine recomb adjuvanted   aspirin 81 MG tablet   cephALEXin 500 MG capsule Commonly known as: KEFLEX   chlorthalidone 25 MG tablet Commonly known as: HYGROTON   Comirnaty syringe Generic drug: COVID-19 mRNA vaccine (Pfizer)   glimepiride 2 MG tablet Commonly known as: AMARYL   losartan 25 MG tablet Commonly known as: Lawyer COVID-19 Vac Bivalent injection Generic drug: COVID-19 mRNA bivalent vaccine Proofreader)   Pfizer-BioNT COVID-19 Vac-TriS Susp injection Generic drug: COVID-19 mRNA Vac-TriS (Pfizer)   tadalafil 20 MG tablet Commonly known as: CIALIS   vardenafil 20 MG tablet Commonly known as: LEVITRA   verapamil 240  MG CR tablet Commonly known as: CALAN-SR       TAKE these medications    cholecalciferol 25 MCG (1000 UNIT) tablet Commonly known as: VITAMIN D3 Take 1,000 Units by mouth daily.   doxazosin 1 MG tablet Commonly known as: CARDURA Take 1  tablet (1 mg total) by mouth daily for blood pressure. What changed: Another medication with the same name was removed. Continue taking this medication, and follow the directions you see here.   glucose blood test strip Use to test daily as directed (Use to test daily as directed)   glucose blood test strip Use to test blood sugar daily as directed   lisinopril 20 MG tablet Commonly known as: ZESTRIL Take 1 tablet (20 mg total) by mouth daily.   simvastatin 20 MG tablet Commonly known as: ZOCOR Take 1 tablet (20 mg total) by mouth daily. What changed: Another medication with the same name was removed. Continue taking this medication, and follow the directions you see here.        Follow-up Information     Burton Apley, MD. Schedule an appointment as soon as possible for a visit in 1 week(s).   Specialty: Internal Medicine Contact information: [address] [place] [address] [phone]                 Discharge Exam: Ceasar Mons Weights   04/13/23 1739  Weight: 114 kg    Constitutional: Awake alert and oriented x3, no associated distress.   Respiratory: clear to auscultation bilaterally, no wheezing, no crackles. Normal respiratory effort. No accessory muscle use.  Cardiovascular: Regular rate and rhythm, no murmurs / rubs / gallops. No extremity edema. 2+ pedal pulses. No carotid bruits.  Abdomen: Abdomen is soft and nontender.  No evidence of intra-abdominal masses.  Positive bowel sounds noted in all quadrants.   Musculoskeletal: No joint deformity upper and lower extremities. Good ROM, no contractures. Normal muscle tone.     Condition at discharge: fair  The results of significant diagnostics from this hospitalization (including imaging, microbiology, ancillary and laboratory) are listed below for reference.   Imaging Studies: CT Head Wo Contrast  Result Date: 04/13/2023 CLINICAL DATA:  Altered mental status. EXAM: CT HEAD WITHOUT CONTRAST  TECHNIQUE: Contiguous axial images were obtained from the base of the skull through the vertex without intravenous contrast. RADIATION DOSE REDUCTION: This exam was performed according to the departmental dose-optimization program which includes automated exposure control, adjustment of the mA and/or kV according to patient size and/or use of iterative reconstruction technique. COMPARISON:  None Available. FINDINGS: Brain: There is mild cerebral atrophy with widening of the extra-axial spaces and ventricular dilatation. There are areas of decreased attenuation within the white matter tracts of the supratentorial brain, consistent with microvascular disease changes. Vascular: No hyperdense vessel or unexpected calcification. Skull: Normal. Negative for fracture or focal lesion. Sinuses/Orbits: No acute finding. Other: None. IMPRESSION: 1. Generalized cerebral atrophy with chronic white matter small vessel ischemic changes. 2. No acute intracranial abnormality. Electronically Signed   By: Aram Candela M.D.   On: 04/13/2023 20:50    Microbiology: Results for orders placed or performed during the hospital encounter of 07/14/12  Surgical pcr screen     Status: None   Collection Time: 07/14/12  7:00 AM   Specimen: Nasal Mucosa; Nasal Swab  Result Value Ref Range Status   MRSA, PCR NEGATIVE NEGATIVE Final   Staphylococcus aureus NEGATIVE NEGATIVE Final    Comment:  The Xpert SA Assay (FDA approved for NASAL specimens in patients over 20 years of age), is one component of a comprehensive surveillance program.  Test performance has been validated by [employer] for patients greater than or equal to 58 year old. It is not intended to diagnose infection nor to guide or monitor treatment.    Labs: CBC: No results for input(s): "WBC", "NEUTROABS", "HGB", "HCT", "MCV", "PLT" in the last 168 hours. Basic Metabolic Panel: No results for input(s): "NA", "K", "CL", "CO2", "GLUCOSE", "BUN",  "CREATININE", "CALCIUM", "MG", "PHOS" in the last 168 hours. Liver Function Tests: No results for input(s): "AST", "ALT", "ALKPHOS", "BILITOT", "PROT", "ALBUMIN" in the last 168 hours. CBG: No results for input(s): "GLUCAP" in the last 168 hours.  Discharge time spent: greater than 30 minutes.  Signed: Marinda Elk, MD [facility] 04/23/2023

## 2023-04-27 ENCOUNTER — Other Ambulatory Visit (HOSPITAL_COMMUNITY): Payer: Self-pay

## 2023-06-09 ENCOUNTER — Other Ambulatory Visit (HOSPITAL_COMMUNITY): Payer: Self-pay

## 2023-06-10 ENCOUNTER — Other Ambulatory Visit: Payer: Self-pay

## 2023-06-10 ENCOUNTER — Other Ambulatory Visit (HOSPITAL_COMMUNITY): Payer: Self-pay

## 2023-06-10 MED ORDER — TADALAFIL 20 MG PO TABS
20.0000 mg | ORAL_TABLET | ORAL | 11 refills | Status: AC
  Filled 2023-06-10: qty 20, 20d supply, fill #0
  Filled 2023-08-25: qty 20, 20d supply, fill #1
  Filled 2023-11-09: qty 20, 20d supply, fill #2
  Filled 2024-02-02 (×2): qty 20, 20d supply, fill #3
  Filled 2024-04-25: qty 20, 20d supply, fill #4

## 2023-06-10 MED ORDER — HYDROCHLOROTHIAZIDE 12.5 MG PO CAPS
12.5000 mg | ORAL_CAPSULE | Freq: Every day | ORAL | 0 refills | Status: AC
  Filled 2023-06-10: qty 90, 90d supply, fill #0

## 2023-06-29 ENCOUNTER — Other Ambulatory Visit (HOSPITAL_COMMUNITY): Payer: Self-pay

## 2023-07-26 ENCOUNTER — Other Ambulatory Visit (HOSPITAL_COMMUNITY): Payer: Self-pay

## 2023-08-25 ENCOUNTER — Other Ambulatory Visit (HOSPITAL_COMMUNITY): Payer: Self-pay

## 2023-09-12 ENCOUNTER — Other Ambulatory Visit (HOSPITAL_COMMUNITY): Payer: Self-pay

## 2023-09-12 MED ORDER — HYDROCHLOROTHIAZIDE 12.5 MG PO CAPS
12.5000 mg | ORAL_CAPSULE | Freq: Every day | ORAL | 4 refills | Status: AC
  Filled 2023-09-12: qty 90, 90d supply, fill #0
  Filled 2023-12-07: qty 90, 90d supply, fill #1
  Filled 2024-03-13 – 2024-03-14 (×2): qty 90, 90d supply, fill #2
  Filled 2024-06-12: qty 90, 90d supply, fill #3

## 2023-09-12 MED ORDER — LISINOPRIL 20 MG PO TABS
20.0000 mg | ORAL_TABLET | Freq: Every day | ORAL | 4 refills | Status: AC
  Filled 2023-09-28: qty 90, 90d supply, fill #0
  Filled 2023-12-28: qty 90, 90d supply, fill #1
  Filled 2024-03-28: qty 90, 90d supply, fill #2
  Filled 2024-06-28: qty 90, 90d supply, fill #3

## 2023-09-12 MED ORDER — DOXAZOSIN MESYLATE 1 MG PO TABS
1.0000 mg | ORAL_TABLET | Freq: Every day | ORAL | 4 refills | Status: AC
  Filled 2023-10-27: qty 90, 90d supply, fill #0
  Filled 2024-02-02 (×2): qty 90, 90d supply, fill #1
  Filled 2024-04-25: qty 90, 90d supply, fill #2

## 2023-09-12 MED ORDER — SIMVASTATIN 20 MG PO TABS
20.0000 mg | ORAL_TABLET | Freq: Every day | ORAL | 4 refills | Status: AC
  Filled 2023-09-28: qty 90, 90d supply, fill #0
  Filled 2023-12-28: qty 90, 90d supply, fill #1
  Filled 2024-03-28: qty 90, 90d supply, fill #2
  Filled 2024-06-28: qty 90, 90d supply, fill #3

## 2023-09-14 ENCOUNTER — Other Ambulatory Visit (HOSPITAL_COMMUNITY): Payer: Self-pay

## 2023-09-28 ENCOUNTER — Other Ambulatory Visit (HOSPITAL_COMMUNITY): Payer: Self-pay

## 2023-10-27 ENCOUNTER — Other Ambulatory Visit (HOSPITAL_COMMUNITY): Payer: Self-pay

## 2023-11-02 ENCOUNTER — Other Ambulatory Visit (HOSPITAL_COMMUNITY): Payer: Self-pay

## 2023-11-09 ENCOUNTER — Other Ambulatory Visit: Payer: Self-pay

## 2023-11-09 ENCOUNTER — Other Ambulatory Visit (HOSPITAL_COMMUNITY): Payer: Self-pay

## 2023-12-03 ENCOUNTER — Other Ambulatory Visit (HOSPITAL_COMMUNITY): Payer: Self-pay

## 2023-12-03 MED ORDER — MUPIROCIN 2 % EX OINT
TOPICAL_OINTMENT | CUTANEOUS | 0 refills | Status: AC
  Filled 2023-12-03: qty 22, 30d supply, fill #0

## 2023-12-07 ENCOUNTER — Other Ambulatory Visit (HOSPITAL_COMMUNITY): Payer: Self-pay

## 2023-12-28 ENCOUNTER — Other Ambulatory Visit (HOSPITAL_COMMUNITY): Payer: Self-pay

## 2023-12-28 ENCOUNTER — Other Ambulatory Visit: Payer: Self-pay

## 2024-02-02 ENCOUNTER — Other Ambulatory Visit (HOSPITAL_COMMUNITY): Payer: Self-pay

## 2024-02-03 ENCOUNTER — Other Ambulatory Visit: Payer: Self-pay

## 2024-03-13 ENCOUNTER — Other Ambulatory Visit (HOSPITAL_COMMUNITY): Payer: Self-pay

## 2024-03-14 ENCOUNTER — Other Ambulatory Visit (HOSPITAL_COMMUNITY): Payer: Self-pay

## 2024-03-14 ENCOUNTER — Other Ambulatory Visit: Payer: Self-pay

## 2024-03-28 ENCOUNTER — Other Ambulatory Visit (HOSPITAL_COMMUNITY): Payer: Self-pay

## 2024-03-28 ENCOUNTER — Other Ambulatory Visit: Payer: Self-pay

## 2024-03-29 ENCOUNTER — Other Ambulatory Visit (HOSPITAL_COMMUNITY): Payer: Self-pay

## 2024-04-25 ENCOUNTER — Other Ambulatory Visit (HOSPITAL_COMMUNITY): Payer: Self-pay

## 2024-06-08 ENCOUNTER — Other Ambulatory Visit: Payer: Self-pay

## 2024-06-12 ENCOUNTER — Other Ambulatory Visit (HOSPITAL_COMMUNITY): Payer: Self-pay

## 2024-06-28 ENCOUNTER — Other Ambulatory Visit (HOSPITAL_COMMUNITY): Payer: Self-pay

## 2024-06-29 ENCOUNTER — Other Ambulatory Visit (HOSPITAL_COMMUNITY): Payer: Self-pay
